# Patient Record
Sex: Female | Born: 1950 | Race: White | Hispanic: No | Marital: Married | Smoking: Former smoker
Health system: Southern US, Community
[De-identification: ages and names within clinical notes are randomized; demographics above are authoritative.]

## PROBLEM LIST (undated history)

## (undated) DIAGNOSIS — E119 Type 2 diabetes mellitus without complications: Secondary | ICD-10-CM

## (undated) DIAGNOSIS — E785 Hyperlipidemia, unspecified: Secondary | ICD-10-CM

## (undated) DIAGNOSIS — E538 Deficiency of other specified B group vitamins: Secondary | ICD-10-CM

## (undated) DIAGNOSIS — I10 Essential (primary) hypertension: Secondary | ICD-10-CM

## (undated) DIAGNOSIS — I7090 Unspecified atherosclerosis: Secondary | ICD-10-CM

## (undated) DIAGNOSIS — I739 Peripheral vascular disease, unspecified: Secondary | ICD-10-CM

## (undated) HISTORY — DX: Essential (primary) hypertension: I10

---

## 2009-01-05 ENCOUNTER — Ambulatory Visit: Payer: Self-pay | Admitting: Internal Medicine

## 2016-06-17 ENCOUNTER — Ambulatory Visit (INDEPENDENT_AMBULATORY_CARE_PROVIDER_SITE_OTHER): Payer: Commercial Managed Care - HMO | Admitting: Vascular Surgery

## 2016-06-17 ENCOUNTER — Other Ambulatory Visit (INDEPENDENT_AMBULATORY_CARE_PROVIDER_SITE_OTHER): Payer: Self-pay | Admitting: Podiatry

## 2016-06-17 ENCOUNTER — Encounter (INDEPENDENT_AMBULATORY_CARE_PROVIDER_SITE_OTHER): Payer: Commercial Managed Care - HMO

## 2016-06-17 ENCOUNTER — Other Ambulatory Visit (INDEPENDENT_AMBULATORY_CARE_PROVIDER_SITE_OTHER): Payer: Self-pay | Admitting: Vascular Surgery

## 2016-06-17 ENCOUNTER — Encounter (INDEPENDENT_AMBULATORY_CARE_PROVIDER_SITE_OTHER): Payer: Self-pay | Admitting: Vascular Surgery

## 2016-06-17 ENCOUNTER — Encounter (INDEPENDENT_AMBULATORY_CARE_PROVIDER_SITE_OTHER): Payer: Self-pay

## 2016-06-17 DIAGNOSIS — I70219 Atherosclerosis of native arteries of extremities with intermittent claudication, unspecified extremity: Secondary | ICD-10-CM | POA: Insufficient documentation

## 2016-06-17 DIAGNOSIS — R0989 Other specified symptoms and signs involving the circulatory and respiratory systems: Secondary | ICD-10-CM

## 2016-06-17 DIAGNOSIS — M79675 Pain in left toe(s): Secondary | ICD-10-CM | POA: Diagnosis not present

## 2016-06-17 DIAGNOSIS — I743 Embolism and thrombosis of arteries of the lower extremities: Secondary | ICD-10-CM | POA: Diagnosis not present

## 2016-06-17 DIAGNOSIS — I998 Other disorder of circulatory system: Secondary | ICD-10-CM | POA: Diagnosis not present

## 2016-06-17 DIAGNOSIS — I70229 Atherosclerosis of native arteries of extremities with rest pain, unspecified extremity: Secondary | ICD-10-CM | POA: Diagnosis not present

## 2016-06-17 DIAGNOSIS — F17200 Nicotine dependence, unspecified, uncomplicated: Secondary | ICD-10-CM | POA: Insufficient documentation

## 2016-06-17 NOTE — Progress Notes (Signed)
[facility] Admission History & Physical  MRN : [id number]  Sherri Dawson is a 65 y.o. (08-30-1950) female who presents with chief complaint of  Chief Complaint  Patient presents with  . Follow-up  .  History of Present Illness: I am asked to evaluate the patient emergently by Dr Elvina Mattes.  The patient is seen for evaluation of painful lower extremities and diminished pulses associated with abrupt onset of pain and blue discoloration of the left great and second toes.  The patient notes the change has been present for a couple weeks and has not been improving.  It is very painful but there is no ulceration or drainage.  No specific history of trauma noted by the patient.  The patient denies fever or chills.  the patient does have diabetes which has been difficult to control.  Patient notes prior to the discoloration developing the extremities were not painful with ambulation or activity.    The patient denies rest pain or dangling of an extremity off the side of the bed during the night for relief. No prior interventions or surgeries.  No history of back problems or DJD of the lumbar sacral spine.   The patient denies amaurosis fugax or recent TIA symptoms. There are no recent neurological changes noted. The patient denies history of DVT, PE or superficial thrombophlebitis. The patient denies recent episodes of angina or shortness of breath.   ABI's Rt=1.12 and Lt=0.75  No outpatient prescriptions have been marked as taking for the 06/17/16 encounter (Office Visit) with Katha Cabal, MD.    History reviewed. No pertinent past medical history.  History reviewed. No pertinent surgical history.  Social History Social History  Substance Use Topics  . Smoking status: Current Every Day Smoker  . Smokeless tobacco: Never Used  . Alcohol use Yes    Family History History reviewed. No pertinent family history. No family history of bleeding/clotting  disorders, porphyria or autoimmune disease   No Known Allergies   REVIEW OF SYSTEMS (Negative unless checked)  Constitutional: [] Weight loss  [] Fever  [] Chills Cardiac: [] Chest pain   [] Chest pressure   [] Palpitations   [] Shortness of breath when laying flat   [] Shortness of breath with exertion. Vascular:  [x] Pain in legs with walking   [x] Pain in legs at rest  [] History of DVT   [] Phlebitis   [x] Swelling in legs   [] Varicose veins   [] Non-healing ulcers Pulmonary:   [] Uses home oxygen   [] Productive cough   [] Hemoptysis   [] Wheeze  [] COPD   [] Asthma Neurologic:  [] Dizziness   [] Seizures   [] History of stroke   [] History of TIA  [] Aphasia   [] Vissual changes   [] Weakness or numbness in arm   [] Weakness or numbness in leg Musculoskeletal:   [] Joint swelling   [] Joint pain   [] Low back pain Hematologic:  [] Easy bruising  [] Easy bleeding   [] Hypercoagulable state   [] Anemic Gastrointestinal:  [] Diarrhea   [] Vomiting  [] Gastroesophageal reflux/heartburn   [] Difficulty swallowing. Genitourinary:  [] Chronic kidney disease   [] Difficult urination  [] Frequent urination   [] Blood in urine Skin:  [] Rashes   [] Ulcers  Psychological:  [] History of anxiety   []  History of major depression.  Physical Examination  Vitals:   06/17/16 1442  BP: (!) 192/104  Pulse: 89  Resp: 17  Weight: 155 lb (70.3 kg)  Height: 5' 4"  (1.626 m)   Body mass index is 26.61 kg/m. Gen: WD/WN, NAD Head: [place]/AT, No temporalis wasting.  Ear/Nose/Throat:  Hearing grossly intact, nares w/o erythema or drainage, poor dentition Eyes: PER, EOMI, sclera nonicteric.  Neck: Supple, no masses.  No bruit or JVD.  Pulmonary:  Good air movement, clear to auscultation bilaterally, no use of accessory muscles.  Cardiac: RRR, normal S1, S2, no Murmurs. Vascular:  Left first and second toes deeply cyanotic to the level of the met head.  2 second cap refill present, no ulcers Vessel Right Left  Radial Palpable Palpable  Ulnar  Palpable Palpable  Brachial Palpable Palpable  Carotid Palpable Palpable  Femoral Palpable Palpable  Popliteal Palpable 1+ Palpable  PT Palpable Not Palpable  DP Palpable Not Palpable   Gastrointestinal: soft, non-distended. No guarding/no peritoneal signs.  Musculoskeletal: M/S 5/5 throughout.  No deformity or atrophy.  Neurologic: CN 2-12 intact. Pain and light touch intact in extremities.  Symmetrical.  Speech is fluent. Motor exam as listed above. Psychiatric: Judgment intact, Mood & affect appropriate for pt's clinical situation. Dermatologic: No rashes or ulcers noted.  No changes consistent with cellulitis. Lymph : No Cervical lymphadenopathy, no lichenification or skin changes of chronic lymphedema.  CBC No results found for: WBC, HGB, HCT, MCV, PLT  BMET No results found for: NA, K, CL, CO2, GLUCOSE, BUN, CREATININE, CALCIUM, GFRNONAA, GFRAA CrCl cannot be calculated (No order found.).  COAG No results found for: INR, PROTIME  Radiology No results found.  Assessment/Plan 1. 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  This represents a limb threatening ischemia and places the patient at the risk for limb loss.  Patient should undergo angiography of the lower extremities with the hope for intervention for limb salvage.  The risks and benefits as well as the alternative therapies was discussed in detail with the patient.  All questions were answered.  Patient agrees to proceed with angiography.  The patient will follow up with me in the office after the procedure.    A total of 70 minutes was spent with this patient and greater than 50% was spent in counseling and coordination of care with the patient.  Discussion  included the treatment options for vascular disease including indications for surgery and intervention.  Also discussed is the appropriate timing of treatment.  In addition medical therapy was discussed.    2. Atherosclerosis of artery of extremity with rest pain ([place]) SEE plan #1  3. Tobacco dependency The key role of smoking in the process was also discussed smoking cessation was discussed for abut 10 minutes.    Hortencia Pilar, MD  06/17/2016 5:04 PM

## 2016-06-18 ENCOUNTER — Other Ambulatory Visit
Admission: RE | Admit: 2016-06-18 | Discharge: 2016-06-18 | Disposition: A | Discharge status: Home / Self Care | Payer: Commercial Managed Care - HMO | Source: Ambulatory Visit | Attending: Vascular Surgery | Admitting: Vascular Surgery

## 2016-06-18 DIAGNOSIS — I739 Peripheral vascular disease, unspecified: Secondary | ICD-10-CM | POA: Insufficient documentation

## 2016-06-18 LAB — CREATININE, SERUM
Creatinine, Ser: 0.86 mg/dL (ref 0.44–1.00)
GFR calc Af Amer: >60 mL/min (ref >60)
GFR calc non Af Amer: >60 mL/min (ref >60)

## 2016-06-18 LAB — BUN: BUN: 13 mg/dL (ref 6–20)

## 2016-06-19 ENCOUNTER — Encounter: Payer: Self-pay | Admitting: *Deleted

## 2016-06-19 ENCOUNTER — Ambulatory Visit
Admission: RE | Admit: 2016-06-19 | Discharge: 2016-06-19 | Disposition: A | Discharge status: Home / Self Care | Payer: Commercial Managed Care - HMO | Source: Ambulatory Visit | Attending: Vascular Surgery | Admitting: Vascular Surgery

## 2016-06-19 ENCOUNTER — Encounter
Admission: RE | Disposition: A | Discharge status: Home / Self Care | Payer: Self-pay | Source: Ambulatory Visit | Attending: Vascular Surgery

## 2016-06-19 DIAGNOSIS — I70228 Atherosclerosis of native arteries of extremities with rest pain, other extremity: Secondary | ICD-10-CM | POA: Diagnosis not present

## 2016-06-19 DIAGNOSIS — I8290 Acute embolism and thrombosis of unspecified vein: Secondary | ICD-10-CM | POA: Insufficient documentation

## 2016-06-19 DIAGNOSIS — F17200 Nicotine dependence, unspecified, uncomplicated: Secondary | ICD-10-CM | POA: Insufficient documentation

## 2016-06-19 DIAGNOSIS — I70212 Atherosclerosis of native arteries of extremities with intermittent claudication, left leg: Secondary | ICD-10-CM | POA: Diagnosis not present

## 2016-06-19 HISTORY — PX: PERIPHERAL VASCULAR CATHETERIZATION: SHX172C

## 2016-06-19 SURGERY — LOWER EXTREMITY ANGIOGRAPHY
Anesthesia: Moderate Sedation | Site: Leg Lower | Laterality: Left

## 2016-06-19 MED ORDER — MIDAZOLAM HCL 2 MG/2ML IJ SOLN
INTRAMUSCULAR | Status: DC | PRN
  Administered 2016-06-19 (×2): 1 mg via INTRAVENOUS
  Administered 2016-06-19: 2 mg via INTRAVENOUS

## 2016-06-19 MED ORDER — HYDRALAZINE HCL 20 MG/ML IJ SOLN: 5.0000 mg | INTRAMUSCULAR | Status: DC | PRN

## 2016-06-19 MED ORDER — HYDROMORPHONE HCL 1 MG/ML IJ SOLN
INTRAMUSCULAR | Status: AC
  Filled 2016-06-19: qty 1

## 2016-06-19 MED ORDER — MIDAZOLAM HCL 5 MG/5ML IJ SOLN
INTRAMUSCULAR | Status: AC
Start: 2016-06-19 — End: 2016-06-19
  Filled 2016-06-19: qty 5

## 2016-06-19 MED ORDER — LIDOCAINE HCL (PF) 1 % IJ SOLN
INTRAMUSCULAR | Status: AC
  Filled 2016-06-19: qty 30

## 2016-06-19 MED ORDER — CLOPIDOGREL BISULFATE 75 MG PO TABS: 75.0000 mg | ORAL_TABLET | Freq: Every day | ORAL | 4 refills | Status: DC

## 2016-06-19 MED ORDER — ONDANSETRON HCL 4 MG/2ML IJ SOLN: 4.0000 mg | Freq: Four times a day (QID) | INTRAMUSCULAR | Status: DC | PRN

## 2016-06-19 MED ORDER — ACETAMINOPHEN 325 MG PO TABS: 325.0000 mg | ORAL_TABLET | ORAL | Status: DC | PRN

## 2016-06-19 MED ORDER — MORPHINE SULFATE (PF) 4 MG/ML IV SOLN: 2.0000 mg | INTRAVENOUS | Status: DC | PRN

## 2016-06-19 MED ORDER — OXYCODONE HCL 5 MG PO TABS: 5.0000 mg | ORAL_TABLET | ORAL | Status: DC | PRN

## 2016-06-19 MED ORDER — FENTANYL CITRATE (PF) 100 MCG/2ML IJ SOLN
INTRAMUSCULAR | Status: AC
  Filled 2016-06-19: qty 2

## 2016-06-19 MED ORDER — DEXTROSE 5 % IV SOLN: 1.5000 g | INTRAVENOUS | Status: DC

## 2016-06-19 MED ORDER — DOCUSATE SODIUM 100 MG PO CAPS: 100.0000 mg | ORAL_CAPSULE | Freq: Every day | ORAL | Status: DC

## 2016-06-19 MED ORDER — SODIUM CHLORIDE 0.9 % IV SOLN
INTRAVENOUS | Status: DC
  Administered 2016-06-19: 13:00:00 via INTRAVENOUS

## 2016-06-19 MED ORDER — PANTOPRAZOLE SODIUM 40 MG PO TBEC: 40.0000 mg | DELAYED_RELEASE_TABLET | Freq: Every day | ORAL | Status: DC

## 2016-06-19 MED ORDER — CLOPIDOGREL BISULFATE 75 MG PO TABS
ORAL_TABLET | ORAL | Status: AC
  Filled 2016-06-19: qty 4

## 2016-06-19 MED ORDER — CLOPIDOGREL BISULFATE 75 MG PO TABS
300.0000 mg | ORAL_TABLET | ORAL | Status: AC
  Administered 2016-06-19: 300 mg via ORAL

## 2016-06-19 MED ORDER — BACITRACIN-NEOMYCIN-POLYMYXIN 400-5-5000 EX OINT
TOPICAL_OINTMENT | CUTANEOUS | Status: AC
  Filled 2016-06-19: qty 1

## 2016-06-19 MED ORDER — HEPARIN (PORCINE) IN NACL 2-0.9 UNIT/ML-% IJ SOLN
INTRAMUSCULAR | Status: AC
  Filled 2016-06-19: qty 1000

## 2016-06-19 MED ORDER — ALUM & MAG HYDROXIDE-SIMETH 200-200-20 MG/5ML PO SUSP: 15.0000 mL | ORAL | Status: DC | PRN

## 2016-06-19 MED ORDER — METHYLPREDNISOLONE SODIUM SUCC 125 MG IJ SOLR: 125.0000 mg | INTRAMUSCULAR | Status: DC | PRN

## 2016-06-19 MED ORDER — LABETALOL HCL 5 MG/ML IV SOLN: 10.0000 mg | INTRAVENOUS | Status: DC | PRN

## 2016-06-19 MED ORDER — HYDROMORPHONE HCL 1 MG/ML IJ SOLN: 1.0000 mg | Freq: Once | INTRAMUSCULAR | Status: DC

## 2016-06-19 MED ORDER — HEPARIN SODIUM (PORCINE) 1000 UNIT/ML IJ SOLN
INTRAMUSCULAR | Status: DC | PRN
  Administered 2016-06-19: 5000 [IU] via INTRAVENOUS

## 2016-06-19 MED ORDER — HEPARIN SODIUM (PORCINE) 1000 UNIT/ML IJ SOLN
INTRAMUSCULAR | Status: AC
  Filled 2016-06-19: qty 1

## 2016-06-19 MED ORDER — IOPAMIDOL (ISOVUE-300) INJECTION 61%
INTRAVENOUS | Status: DC | PRN
  Administered 2016-06-19: 70 mL via INTRAVENOUS

## 2016-06-19 MED ORDER — ACETAMINOPHEN 325 MG RE SUPP: 325.0000 mg | RECTAL | Status: DC | PRN

## 2016-06-19 MED ORDER — FENTANYL CITRATE (PF) 100 MCG/2ML IJ SOLN
INTRAMUSCULAR | Status: DC | PRN
  Administered 2016-06-19 (×3): 50 ug via INTRAVENOUS

## 2016-06-19 MED ORDER — FAMOTIDINE 20 MG PO TABS: 40.0000 mg | ORAL_TABLET | ORAL | Status: DC | PRN

## 2016-06-19 MED ORDER — METOPROLOL TARTRATE 5 MG/5ML IV SOLN: 5.0000 mg | Freq: Four times a day (QID) | INTRAVENOUS | Status: DC

## 2016-06-19 MED ORDER — HYDROMORPHONE HCL 1 MG/ML IJ SOLN
0.5000 mg | Freq: Once | INTRAMUSCULAR | Status: AC
  Administered 2016-06-19: 0.5 mg via INTRAVENOUS

## 2016-06-19 SURGICAL SUPPLY — 18 items
BALLN LUTONIX DCB 5X40X130 (BALLOONS) ×3
BALLN LUTONIX DCB 6X40X130 (BALLOONS) ×3
BALLOON LUTONIX DCB 5X40X130 (BALLOONS) ×1 IMPLANT
BALLOON LUTONIX DCB 6X40X130 (BALLOONS) ×1 IMPLANT
CATH PIG 70CM (CATHETERS) ×3 IMPLANT
DEVICE PRESTO INFLATION (MISCELLANEOUS) ×3 IMPLANT
DEVICE STARCLOSE SE CLOSURE (Vascular Products) ×3 IMPLANT
DEVICE TORQUE (MISCELLANEOUS) ×3 IMPLANT
GLIDEWIRE ANGLED SS 035X260CM (WIRE) ×3 IMPLANT
LIFESTENT 7X40X130 (Permanent Stent) ×3 IMPLANT
PACK ANGIOGRAPHY (CUSTOM PROCEDURE TRAY) ×3 IMPLANT
SET INTRO CAPELLA COAXIAL (SET/KITS/TRAYS/PACK) ×3 IMPLANT
SHEATH BRITE TIP 5FRX11 (SHEATH) ×3 IMPLANT
SHEATH RAABE 6FR (SHEATH) ×3 IMPLANT
SYR MEDRAD MARK V 150ML (SYRINGE) ×3 IMPLANT
TUBING CONTRAST HIGH PRESS 72 (TUBING) ×3 IMPLANT
WIRE HI TORQ VERSACORE 300 (WIRE) ×3 IMPLANT
WIRE J 3MM .035X145CM (WIRE) ×3 IMPLANT

## 2016-06-19 NOTE — Op Note (Signed)
[facility] Percutaneous Study/Intervention Procedural Note   Date of Surgery: 06/19/2016  Surgeon:  Katha Cabal, MD.  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 Introduction catheter into left lower extremity 3rd order catheter placement  2. Contrast injection left lower extremity for distal runoff   3. Percutaneous transluminal angioplasty and stent placement 7 x 40 life stent left superficial femoral artery  4. Star close closure right common femoral arteriotomy  Anesthesia: Conscious sedation was administered under my direct supervision. IV Versed plus fentanyl were utilized. Continuous ECG, pulse oximetry and blood pressure was monitored throughout the entire procedure.  Conscious sedation was for a total of 50 minutes.  Sheath: 6 French Rabi right common femoral artery  Contrast: 70 cc  Fluoroscopy Time: 5.3 minutes  Indications: Sherri Dawson presents with ischemia of the left great toe and second toe. In the office her ABI was 0.7. The risks and benefits are reviewed all questions answered patient agrees to proceed.  Procedure: Sherri Dawson is a 66 y.o. y.o. female who was identified and appropriate procedural time out was performed. The patient was then placed supine on the table and prepped and draped in the usual sterile fashion.   Ultrasound was placed in the sterile sleeve and the right groin was evaluated the right common femoral artery was echolucent and pulsatile indicating patency.  Image was recorded for the permanent record and under real-time visualization a microneedle was inserted into the common femoral artery microwire followed by a micro-sheath.  A J-wire was then advanced through the micro-sheath and a  5 [place] sheath was then  inserted over a J-wire. J-wire was then advanced and a 5 French pigtail catheter was positioned at the level of T12. AP projection of the aorta was then obtained. Pigtail catheter was repositioned to above the bifurcation and a RAO view of the pelvis was obtained.  Subsequently a pigtail catheter with the stiff angle Glidewire was used to cross the aortic bifurcation the catheter wire were advanced down into the left distal external iliac artery. Oblique view of the femoral bifurcation was then obtained and subsequently the wire was reintroduced and the pigtail catheter negotiated into the SFA representing third order catheter placement. Distal runoff was then performed.  5000 units of heparin was then given and allowed to circulate and a 6 [place] Rabi sheath was advanced up and over the bifurcation and positioned in the femoral artery  Sherri Dawson  catheter and stiff angle Glidewire were then negotiated down into the distal popliteal.  Distal runoff was then completed by hand injection through the catheter. The wire was then reintroduced and a 5 x 40 Lutonix balloon was used to angioplasty the superficial femoral artery. Inflations were to 12 atmospheres for 2 minutes. Follow-up imaging demonstrated greater than 50% residual stenosis and therefore a 7 x 40 life stent was deployed and subsequently postdilated with a  6 x 40 Lutonix balloon was utilized inflating to 12 atm for 2 full minutes.  Distal runoff was then reassessed.  After review of these images the sheath is pulled into the right external iliac oblique of the common femoral is obtained and a Star close device deployed. There no immediate complications.   Findings: The abdominal aorta is opacified with a bolus injection contrast. Renal arteries are widely patent. The aorta itself has diffuse disease but no hemodynamically significant lesions. The common and  external iliac arteries are widely patent bilaterally.  The left common femoral is widely  patent as is the profunda femoris.  The SFA does indeed have a significant stenosis in its midportion this is a focal 1 cm lesion.  The distal popliteal demonstrates wide patency trifurcation is patent however the anterior tibial is somewhat small and does not contribute significantly to the flow with the foot the posterior tibial is the dominant vessel and has and tenuous in-line flow down to the foot filling the pedal arch. The peroneal is patent although somewhat small.   Following angioplasty the SFA shows an inadequate dilatation and therefore a stent is placed and postdilated to 6 mm with an excellent result.   Summary: Successful recanalization left lower cavity for limb salvage    Disposition: Patient was taken to the recovery room in stable condition having tolerated the procedure well.  Dawson, Sherri Lory 06/19/2016,2:53 PM

## 2016-06-19 NOTE — H&P (Signed)
[facility] History & Physical Update  The patient was interviewed and re-examined.  The patient's previous History and Physical has been reviewed and is unchanged.  There is no change in the plan of care. We plan to proceed with the scheduled procedure.  Hortencia Pilar, MD  06/19/2016, 1:25 PM

## 2016-06-19 NOTE — Discharge Instructions (Signed)

## 2016-06-20 ENCOUNTER — Encounter: Payer: Self-pay | Admitting: Vascular Surgery

## 2016-06-27 ENCOUNTER — Other Ambulatory Visit: Payer: Self-pay | Admitting: Internal Medicine

## 2016-06-27 DIAGNOSIS — R739 Hyperglycemia, unspecified: Secondary | ICD-10-CM | POA: Diagnosis not present

## 2016-06-27 DIAGNOSIS — I739 Peripheral vascular disease, unspecified: Secondary | ICD-10-CM

## 2016-06-27 DIAGNOSIS — Z0000 Encounter for general adult medical examination without abnormal findings: Secondary | ICD-10-CM | POA: Diagnosis not present

## 2016-06-27 DIAGNOSIS — I10 Essential (primary) hypertension: Secondary | ICD-10-CM | POA: Diagnosis not present

## 2016-07-03 ENCOUNTER — Ambulatory Visit
Admission: RE | Admit: 2016-07-03 | Discharge: 2016-07-03 | Disposition: A | Discharge status: Home / Self Care | Payer: Commercial Managed Care - HMO | Source: Ambulatory Visit | Attending: Internal Medicine | Admitting: Internal Medicine

## 2016-07-03 DIAGNOSIS — I6523 Occlusion and stenosis of bilateral carotid arteries: Secondary | ICD-10-CM | POA: Diagnosis not present

## 2016-07-03 DIAGNOSIS — I739 Peripheral vascular disease, unspecified: Secondary | ICD-10-CM | POA: Diagnosis not present

## 2016-07-31 ENCOUNTER — Other Ambulatory Visit (INDEPENDENT_AMBULATORY_CARE_PROVIDER_SITE_OTHER): Payer: Self-pay | Admitting: Vascular Surgery

## 2016-07-31 DIAGNOSIS — I739 Peripheral vascular disease, unspecified: Secondary | ICD-10-CM

## 2016-08-01 ENCOUNTER — Encounter (INDEPENDENT_AMBULATORY_CARE_PROVIDER_SITE_OTHER): Payer: Self-pay | Admitting: Vascular Surgery

## 2016-08-01 ENCOUNTER — Ambulatory Visit (INDEPENDENT_AMBULATORY_CARE_PROVIDER_SITE_OTHER): Payer: Commercial Managed Care - HMO

## 2016-08-01 ENCOUNTER — Ambulatory Visit (INDEPENDENT_AMBULATORY_CARE_PROVIDER_SITE_OTHER): Payer: Commercial Managed Care - HMO | Admitting: Vascular Surgery

## 2016-08-01 VITALS — BP 144/92 | HR 92 | Resp 16 | Ht 64.0 in | Wt 158.0 lb

## 2016-08-01 DIAGNOSIS — I739 Peripheral vascular disease, unspecified: Secondary | ICD-10-CM | POA: Diagnosis not present

## 2016-08-01 DIAGNOSIS — M79605 Pain in left leg: Secondary | ICD-10-CM

## 2016-08-01 DIAGNOSIS — E782 Mixed hyperlipidemia: Secondary | ICD-10-CM | POA: Diagnosis not present

## 2016-08-01 DIAGNOSIS — E785 Hyperlipidemia, unspecified: Secondary | ICD-10-CM | POA: Insufficient documentation

## 2016-08-01 DIAGNOSIS — I70229 Atherosclerosis of native arteries of extremities with rest pain, unspecified extremity: Secondary | ICD-10-CM

## 2016-08-01 DIAGNOSIS — M79606 Pain in leg, unspecified: Secondary | ICD-10-CM | POA: Insufficient documentation

## 2016-08-01 DIAGNOSIS — I10 Essential (primary) hypertension: Secondary | ICD-10-CM | POA: Insufficient documentation

## 2016-08-01 DIAGNOSIS — F17200 Nicotine dependence, unspecified, uncomplicated: Secondary | ICD-10-CM | POA: Diagnosis not present

## 2016-08-01 NOTE — Progress Notes (Signed)
MRN : [id number]  Sherri Dawson is a 65 y.o. (22-Feb-1951) female who presents with chief complaint of  Chief Complaint  Patient presents with  . Re-evaluation    Angio done on 06/19/16, ABI  .  History of Present Illness: The patient returns to the office for followup and review status post angiogram with intervention. On 06/19/2016 she underwent PTA and stenting of the left SFA.  The patient notes improvement in the lower extremity symptoms. No interval shortening of the patient's claudication distance, in fact she notes she is walking easily again.  No rest pain symptoms.  No new ulcers or wounds have occurred since the last visit.  There have been no significant changes to the patient's overall health care.  The patient denies amaurosis fugax or recent TIA symptoms. There are no recent neurological changes noted. The patient denies history of DVT, PE or superficial thrombophlebitis. The patient denies recent episodes of angina or shortness of breath.   ABI's Rt=1.06 and Lt=0.99  (previous ABI's Rt=1.12 and Lt=0.75)   Current Meds  Medication Sig  . aspirin EC 81 MG tablet Take by mouth.  . clopidogrel (PLAVIX) 75 MG tablet Take 1 tablet (75 mg total) by mouth daily.  Marland Kitchen ibuprofen (ADVIL,MOTRIN) 200 MG tablet Take 400 mg by mouth every 6 (six) hours as needed for headache or moderate pain.  Marland Kitchen olmesartan-hydrochlorothiazide (BENICAR HCT) 20-12.5 MG tablet Take by mouth.  . simvastatin (ZOCOR) 40 MG tablet Take by mouth.    No past medical history on file.  Past Surgical History:  Procedure Laterality Date  . PERIPHERAL VASCULAR CATHETERIZATION Left 06/19/2016   Procedure: Lower Extremity Angiography;  Surgeon: Katha Cabal, MD;  Location: [facility];  Service: Cardiovascular;  Laterality: Left;    Social History Social History  Substance Use Topics  . Smoking status: Former Smoker    Packs/day: 0.50    Years: 45.00    Types: Cigarettes    Quit date:  06/18/2016  . Smokeless tobacco: Never Used  . Alcohol use Yes    Family History No family history on file. No family history of bleeding/clotting disorders, porphyria or autoimmune disease  No Known Allergies   REVIEW OF SYSTEMS (Negative unless checked)  Constitutional: [] Weight loss  [] Fever  [] Chills Cardiac: [] Chest pain   [] Chest pressure   [] Palpitations   [] Shortness of breath when laying flat   [] Shortness of breath with exertion. Vascular:  [] Pain in legs with walking   [] Pain in legs at rest  [] History of DVT   [] Phlebitis   [] Swelling in legs   [] Varicose veins   [] Non-healing ulcers Pulmonary:   [] Uses home oxygen   [] Productive cough   [] Hemoptysis   [] Wheeze  [] COPD   [] Asthma Neurologic:  [] Dizziness   [] Seizures   [] History of stroke   [] History of TIA  [] Aphasia   [] Vissual changes   [] Weakness or numbness in arm   [] Weakness or numbness in leg Musculoskeletal:   [] Joint swelling   [] Joint pain   [] Low back pain Hematologic:  [] Easy bruising  [] Easy bleeding   [] Hypercoagulable state   [] Anemic Gastrointestinal:  [] Diarrhea   [] Vomiting  [] Gastroesophageal reflux/heartburn   [] Difficulty swallowing. Genitourinary:  [] Chronic kidney disease   [] Difficult urination  [] Frequent urination   [] Blood in urine Skin:  [] Rashes   [] Ulcers  Psychological:  [] History of anxiety   []  History of major depression.  Physical Examination  Vitals:   08/01/16 1530  BP: (!) 144/92  Pulse: 92  Resp: 16  Weight: 158 lb (71.7 kg)  Height: 5\' 4"  (1.626 m)   Body mass index is 27.12 kg/m. Gen: WD/WN, NAD Head: [place]/AT, No temporalis wasting.  Ear/Nose/Throat: Hearing grossly intact, nares w/o erythema or drainage, poor dentition Eyes: PER, EOMI, sclera nonicteric.  Neck: Supple, no masses.  No bruit or JVD.  Pulmonary:  Good air movement, clear to auscultation bilaterally, no use of accessory muscles.  Cardiac: RRR, normal S1, S2, no Murmurs. Vascular: feet pink and warm  bilaterally with brisk cap refill Vessel Right Left  Radial Palpable Palpable  Ulnar Palpable Palpable  Brachial Palpable Palpable  Carotid Palpable Palpable  Femoral Palpable Palpable  Popliteal Palpable Palpable  PT 2+ Palpable 2+ Palpable  DP 2+ Palpable Trace Palpable   Gastrointestinal: soft, non-distended. No guarding/no peritoneal signs.  Musculoskeletal: M/S 5/5 throughout.  No deformity or atrophy.  Neurologic: CN 2-12 intact. Pain and light touch intact in extremities.  Symmetrical.  Speech is fluent. Motor exam as listed above. Psychiatric: Judgment intact, Mood & affect appropriate for pt's clinical situation. Dermatologic: No rashes or ulcers noted.  No changes consistent with cellulitis. Lymph : No Cervical lymphadenopathy, no lichenification or skin changes of chronic lymphedema.  CBC No results found for: WBC, HGB, HCT, MCV, PLT  BMET    Component Value Date/Time   BUN 13 06/18/2016 0914   CREATININE 0.86 06/18/2016 0914   GFRNONAA >60 06/18/2016 0914   GFRAA >60 06/18/2016 0914   CrCl cannot be calculated (Patient's most recent lab result is older than the maximum 21 days allowed.).  COAG No results found for: INR, PROTIME  Radiology US Carotid Bilateral  Result Date: 07/03/2016 CLINICAL DATA:  65 year old female with a history of peripheral artery disease. Cardiovascular risk factors include hypertension, hyperlipidemia, known peripheral vascular disease, tobacco use EXAM: BILATERAL CAROTID DUPLEX ULTRASOUND TECHNIQUE: Pearline Cables scale imaging, color Doppler and duplex ultrasound were performed of bilateral carotid and vertebral arteries in the neck. COMPARISON:  None FINDINGS: Criteria: Quantification of carotid stenosis is based on velocity parameters that correlate the residual internal carotid diameter with NASCET-based stenosis levels, using the diameter of the distal internal carotid lumen as the denominator for stenosis measurement. The following velocity  measurements were obtained: RIGHT ICA:  Systolic 99991111 cm/sec, Diastolic 38 cm/sec CCA:  [id number] cm/sec SYSTOLIC ICA/CCA RATIO:  1.1 ECA:  138 cm/sec LEFT ICA:  Systolic 123456 cm/sec, Diastolic 17 cm/sec CCA:  [id number] cm/sec SYSTOLIC ICA/CCA RATIO:  0.7 ECA:  120 cm/sec Right Brachial SBP: Not acquired Left Brachial SBP: Not acquired RIGHT CAROTID ARTERY: No significant calcified disease of the right common carotid artery. Intermediate waveform maintained. Heterogeneous plaque without significant calcifications at the right carotid bifurcation. Low resistance waveform of the right ICA. No significant tortuosity. RIGHT VERTEBRAL ARTERY: Antegrade flow with low resistance waveform. LEFT CAROTID ARTERY: No significant calcified disease of the left common carotid artery. Intermediate waveform maintained. Heterogeneous plaque at the left carotid bifurcation without significant calcifications. Low resistance waveform of the left ICA. LEFT VERTEBRAL ARTERY:  Antegrade flow with low resistance waveform. Additional: Lymph node of the right neck, not enlarged by head and neck criteria. IMPRESSION: Color duplex indicates minimal heterogeneous plaque, with no hemodynamically significant stenosis by duplex criteria in the extracranial cerebrovascular circulation. Signed, Dulcy Fanny. Earleen Newport, DO [facility] Electronically Signed   By: Corrie Mckusick D.O.   On: 07/03/2016 15:40    Assessment/Plan 1. Atherosclerosis of artery of extremity with rest  pain [facility]) Recommend:  The patient is status post successful angiogram with intervention.  The patient reports that the claudication symptoms and leg pain is essentially gone.   The patient denies lifestyle limiting changes at this point in time.  No further invasive studies, angiography or surgery at this time The patient should continue walking and begin a more formal exercise program.  The patient should continue antiplatelet  therapy and aggressive treatment of the lipid abnormalities  Smoking cessation was again discussed  The patient should continue wearing graduated compression socks 10-15 mmHg strength to control the mild edema.  Patient should undergo noninvasive studies as ordered. The patient will follow up with me after the studies.   - VAS [place] ABI WITH/WO TBI; Future - VAS [place] LOWER EXTREMITY ARTERIAL DUPLEX; Future  2. Tobacco dependency Approximately 5 minutes was spent on discussing smoking cessation  3. Pain of left lower extremity essentially resolved  4. Essential hypertension Continue antihypertensive medications as already ordered and reviewed, no changes at this time.  5. Mixed hyperlipidemia Continue statin as ordered and reviewed, no changes at this time    Hortencia Pilar, MD  08/01/2016 3:54 PM

## 2016-09-19 DIAGNOSIS — I739 Peripheral vascular disease, unspecified: Secondary | ICD-10-CM | POA: Diagnosis not present

## 2016-09-19 DIAGNOSIS — I10 Essential (primary) hypertension: Secondary | ICD-10-CM | POA: Diagnosis not present

## 2016-09-19 DIAGNOSIS — E538 Deficiency of other specified B group vitamins: Secondary | ICD-10-CM | POA: Diagnosis not present

## 2016-09-26 DIAGNOSIS — R739 Hyperglycemia, unspecified: Secondary | ICD-10-CM | POA: Diagnosis not present

## 2016-09-26 DIAGNOSIS — I739 Peripheral vascular disease, unspecified: Secondary | ICD-10-CM | POA: Diagnosis not present

## 2016-09-26 DIAGNOSIS — E538 Deficiency of other specified B group vitamins: Secondary | ICD-10-CM | POA: Diagnosis not present

## 2016-09-26 DIAGNOSIS — Z0000 Encounter for general adult medical examination without abnormal findings: Secondary | ICD-10-CM | POA: Insufficient documentation

## 2016-10-31 ENCOUNTER — Encounter (INDEPENDENT_AMBULATORY_CARE_PROVIDER_SITE_OTHER): Payer: Self-pay

## 2016-10-31 ENCOUNTER — Ambulatory Visit (INDEPENDENT_AMBULATORY_CARE_PROVIDER_SITE_OTHER): Payer: Medicare HMO

## 2016-10-31 ENCOUNTER — Ambulatory Visit (INDEPENDENT_AMBULATORY_CARE_PROVIDER_SITE_OTHER): Payer: Medicare HMO | Admitting: Vascular Surgery

## 2016-10-31 ENCOUNTER — Encounter (INDEPENDENT_AMBULATORY_CARE_PROVIDER_SITE_OTHER): Payer: Self-pay | Admitting: Vascular Surgery

## 2016-10-31 VITALS — BP 149/78 | HR 72 | Resp 16 | Ht 64.0 in | Wt 158.0 lb

## 2016-10-31 DIAGNOSIS — M79605 Pain in left leg: Secondary | ICD-10-CM

## 2016-10-31 DIAGNOSIS — I70229 Atherosclerosis of native arteries of extremities with rest pain, unspecified extremity: Secondary | ICD-10-CM

## 2016-10-31 DIAGNOSIS — I70219 Atherosclerosis of native arteries of extremities with intermittent claudication, unspecified extremity: Secondary | ICD-10-CM | POA: Diagnosis not present

## 2016-10-31 DIAGNOSIS — I743 Embolism and thrombosis of arteries of the lower extremities: Secondary | ICD-10-CM | POA: Diagnosis not present

## 2016-10-31 DIAGNOSIS — I10 Essential (primary) hypertension: Secondary | ICD-10-CM

## 2016-10-31 DIAGNOSIS — E782 Mixed hyperlipidemia: Secondary | ICD-10-CM

## 2016-10-31 NOTE — Progress Notes (Signed)
MRN : [id number]  Sherri Dawson is a 66 y.o. (01-24-1951) female who presents with chief complaint of  Chief Complaint  Patient presents with  . Re-evaluation    ABI follow up ultrasound  .  History of Present Illness: The patient returns to the office for followup and review of the noninvasive studies. There have been no interval changes in lower extremity symptoms. No interval shortening of the patient's claudication distance or development of rest pain symptoms. No new ulcers or wounds have occurred since the last visit.  There have been no significant changes to the patient's overall health care.  The patient denies amaurosis fugax or recent TIA symptoms. There are no recent neurological changes noted. The patient denies history of DVT, PE or superficial thrombophlebitis. The patient denies recent episodes of angina or shortness of breath.   ABI Rt=1.19 and Lt=1.24  Toe tracings are damped on the right and flat on the left (previous ABI's Rt=1.06 and Lt=0.99) Duplex ultrasound of the left leg arterial shows a widely patent stent with uniform velocities  Current Meds  Medication Sig  . aspirin EC 81 MG tablet Take by mouth.  . clopidogrel (PLAVIX) 75 MG tablet Take 1 tablet (75 mg total) by mouth daily.  Marland Kitchen ibuprofen (ADVIL,MOTRIN) 200 MG tablet Take 400 mg by mouth every 6 (six) hours as needed for headache or moderate pain.  Marland Kitchen olmesartan-hydrochlorothiazide (BENICAR HCT) 20-12.5 MG tablet Take by mouth.  . simvastatin (ZOCOR) 40 MG tablet Take by mouth.    No past medical history on file.  Past Surgical History:  Procedure Laterality Date  . PERIPHERAL VASCULAR CATHETERIZATION Left 06/19/2016   Procedure: Lower Extremity Angiography;  Surgeon: Katha Cabal, MD;  Location: [facility];  Service: Cardiovascular;  Laterality: Left;    Social History Social History  Substance Use Topics  . Smoking status: Former Smoker    Packs/day: 0.50    Years:  45.00    Types: Cigarettes    Quit date: 06/18/2016  . Smokeless tobacco: Never Used  . Alcohol use Yes    Family History No family history on file. No family history of bleeding/clotting disorders, porphyria or autoimmune disease   No Known Allergies   REVIEW OF SYSTEMS (Negative unless checked)  Constitutional: [] Weight loss  [] Fever  [] Chills Cardiac: [] Chest pain   [] Chest pressure   [] Palpitations   [] Shortness of breath when laying flat   [] Shortness of breath with exertion. Vascular:  [] Pain in legs with walking   [] Pain in legs at rest  [] History of DVT   [] Phlebitis   [] Swelling in legs   [] Varicose veins   [] Non-healing ulcers Pulmonary:   [] Uses home oxygen   [] Productive cough   [] Hemoptysis   [] Wheeze  [] COPD   [] Asthma Neurologic:  [] Dizziness   [] Seizures   [] History of stroke   [] History of TIA  [] Aphasia   [] Vissual changes   [] Weakness or numbness in arm   [] Weakness or numbness in leg Musculoskeletal:   [] Joint swelling   [] Joint pain   [] Low back pain Hematologic:  [] Easy bruising  [] Easy bleeding   [] Hypercoagulable state   [] Anemic Gastrointestinal:  [] Diarrhea   [] Vomiting  [] Gastroesophageal reflux/heartburn   [] Difficulty swallowing. Genitourinary:  [] Chronic kidney disease   [] Difficult urination  [] Frequent urination   [] Blood in urine Skin:  [] Rashes   [] Ulcers  Psychological:  [] History of anxiety   []  History of major depression.  Physical Examination  Vitals:   10/31/16  1529  BP: (!) 149/78  Pulse: 72  Resp: 16  Weight: 158 lb (71.7 kg)  Height: 5\' 4"  (1.626 m)   Body mass index is 27.12 kg/m. Gen: WD/WN, NAD Head: [place]/AT, No temporalis wasting.  Ear/Nose/Throat: Hearing grossly intact, nares w/o erythema or drainage, poor dentition Eyes: PER, EOMI, sclera nonicteric.  Neck: Supple, no masses.  No bruit or JVD.  Pulmonary:  Good air movement, clear to auscultation bilaterally, no use of accessory muscles.  Cardiac: RRR, normal S1, S2, no  Murmurs. Vascular: feet cool to touch with 2-3 second cap refill Vessel Right Left  Radial Palpable Palpable  Ulnar Palpable Palpable  Brachial Palpable Palpable  Carotid Palpable Palpable  Femoral Palpable Palpable  Popliteal Palpable Palpable  PT Palpable Palpable  DP Palpable Trace Palpable   Gastrointestinal: soft, non-distended. No guarding/no peritoneal signs.  Musculoskeletal: M/S 5/5 throughout.  No deformity or atrophy.  Neurologic: CN 2-12 intact. Pain and light touch intact in extremities.  Symmetrical.  Speech is fluent. Motor exam as listed above. Psychiatric: Judgment intact, Mood & affect appropriate for pt's clinical situation. Dermatologic: No rashes or ulcers noted.  No changes consistent with cellulitis. Lymph : No Cervical lymphadenopathy, no lichenification or skin changes of chronic lymphedema.  CBC No results found for: WBC, HGB, HCT, MCV, PLT  BMET    Component Value Date/Time   BUN 13 06/18/2016 0914   CREATININE 0.86 06/18/2016 0914   GFRNONAA >60 06/18/2016 0914   GFRAA >60 06/18/2016 0914   CrCl cannot be calculated (Patient's most recent lab result is older than the maximum 21 days allowed.).  COAG No results found for: INR, PROTIME  Radiology No results found.  Assessment/Plan 1. 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  The patient does not voice lifestyle limiting changes at this point in time.  Noninvasive studies do not suggest clinically significant change.  No invasive studies, angiography or surgery at this time The patient should continue walking and begin a more formal exercise program.  The patient should continue antiplatelet therapy and aggressive treatment of the lipid abnormalities  No changes in the patient's medications at this time  The patient should continue wearing graduated  compression socks 10-15 mmHg strength to control the mild edema.   - VAS [place] ABI WITH/WO TBI; Future - VAS [place] LOWER EXTREMITY ARTERIAL DUPLEX; Future  2. 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 Essential hypertension Continue antihypertensive medications as already ordered, these medications have been reviewed and there are no changes at this time.   4. Mixed hyperlipidemia Continue statin as ordered and reviewed, no changes at this time   5. Pain of left lower extremity Rest pain has resolved    Hortencia Pilar, MD  10/31/2016 3:55 PM

## 2017-01-17 DIAGNOSIS — E782 Mixed hyperlipidemia: Secondary | ICD-10-CM | POA: Diagnosis not present

## 2017-01-17 DIAGNOSIS — E538 Deficiency of other specified B group vitamins: Secondary | ICD-10-CM | POA: Diagnosis not present

## 2017-01-17 DIAGNOSIS — R739 Hyperglycemia, unspecified: Secondary | ICD-10-CM | POA: Diagnosis not present

## 2017-01-24 DIAGNOSIS — E538 Deficiency of other specified B group vitamins: Secondary | ICD-10-CM | POA: Diagnosis not present

## 2017-01-24 DIAGNOSIS — E119 Type 2 diabetes mellitus without complications: Secondary | ICD-10-CM | POA: Diagnosis not present

## 2017-01-24 DIAGNOSIS — E782 Mixed hyperlipidemia: Secondary | ICD-10-CM | POA: Diagnosis not present

## 2017-05-05 ENCOUNTER — Ambulatory Visit (INDEPENDENT_AMBULATORY_CARE_PROVIDER_SITE_OTHER): Payer: Medicare HMO

## 2017-05-05 ENCOUNTER — Encounter (INDEPENDENT_AMBULATORY_CARE_PROVIDER_SITE_OTHER): Payer: Self-pay | Admitting: Vascular Surgery

## 2017-05-05 ENCOUNTER — Ambulatory Visit (INDEPENDENT_AMBULATORY_CARE_PROVIDER_SITE_OTHER): Payer: Medicare HMO | Admitting: Vascular Surgery

## 2017-05-05 VITALS — BP 166/96 | HR 75 | Resp 16 | Wt 155.0 lb

## 2017-05-05 DIAGNOSIS — I10 Essential (primary) hypertension: Secondary | ICD-10-CM

## 2017-05-05 DIAGNOSIS — I70213 Atherosclerosis of native arteries of extremities with intermittent claudication, bilateral legs: Secondary | ICD-10-CM

## 2017-05-05 DIAGNOSIS — I70219 Atherosclerosis of native arteries of extremities with intermittent claudication, unspecified extremity: Secondary | ICD-10-CM

## 2017-05-05 DIAGNOSIS — E782 Mixed hyperlipidemia: Secondary | ICD-10-CM | POA: Diagnosis not present

## 2017-05-06 NOTE — Progress Notes (Signed)
MRN : [id number]  Sherri Dawson is a 66 y.o. ([birth year]) female who presents with chief complaint of  Chief Complaint  Patient presents with  . Follow-up    20mo abi,lle art  .  History of Present Illness: The patient returns to the office for followup and review of the noninvasive studies. There have been no interval changes in lower extremity symptoms. No interval shortening of the patient's claudication distance or development of rest pain symptoms. No new ulcers or wounds have occurred since the last visit.  There have been no significant changes to the patient's overall health care.  The patient denies amaurosis fugax or recent TIA symptoms. There are no recent neurological changes noted. The patient denies history of DVT, PE or superficial thrombophlebitis. The patient denies recent episodes of angina or shortness of breath.   ABI Rt=1.15 and Lt=1.24  (previous ABI's Rt=1.19 and Lt=1.24) Duplex ultrasound of the left leg arterial show sfa and stent are widely patent  Current Meds  Medication Sig  . aspirin EC 81 MG tablet Take by mouth.  Marland Kitchen ibuprofen (ADVIL,MOTRIN) 200 MG tablet Take 400 mg by mouth every 6 (six) hours as needed for headache or moderate pain.  Marland Kitchen olmesartan-hydrochlorothiazide (BENICAR HCT) 20-12.5 MG tablet Take by mouth.  . simvastatin (ZOCOR) 40 MG tablet Take by mouth.    Past Medical History:  Diagnosis Date  . Hypertension     Past Surgical History:  Procedure Laterality Date  . PERIPHERAL VASCULAR CATHETERIZATION Left 06/19/2016   Procedure: Lower Extremity Angiography;  Surgeon: Katha Cabal, MD;  Location: [facility];  Service: Cardiovascular;  Laterality: Left;    Social History Social History  Substance Use Topics  . Smoking status: Former Smoker    Packs/day: 0.50    Years: 45.00    Types: Cigarettes    Quit date: 06/18/2016  . Smokeless tobacco: Never Used  . Alcohol use Yes    Family History Family History    Problem Relation Age of Onset  . Heart attack Father   . Breast cancer Maternal Aunt     No Known Allergies   REVIEW OF SYSTEMS (Negative unless checked)  Constitutional: [] Weight loss  [] Fever  [] Chills Cardiac: [] Chest pain   [] Chest pressure   [] Palpitations   [] Shortness of breath when laying flat   [] Shortness of breath with exertion. Vascular:  [x] Pain in legs with walking   [] Pain in legs at rest  [] History of DVT   [] Phlebitis   [] Swelling in legs   [] Varicose veins   [] Non-healing ulcers Pulmonary:   [] Uses home oxygen   [] Productive cough   [] Hemoptysis   [] Wheeze  [] COPD   [] Asthma Neurologic:  [] Dizziness   [] Seizures   [] History of stroke   [] History of TIA  [] Aphasia   [] Vissual changes   [] Weakness or numbness in arm   [] Weakness or numbness in leg Musculoskeletal:   [] Joint swelling   [] Joint pain   [] Low back pain Hematologic:  [] Easy bruising  [] Easy bleeding   [] Hypercoagulable state   [] Anemic Gastrointestinal:  [] Diarrhea   [] Vomiting  [] Gastroesophageal reflux/heartburn   [] Difficulty swallowing. Genitourinary:  [] Chronic kidney disease   [] Difficult urination  [] Frequent urination   [] Blood in urine Skin:  [] Rashes   [] Ulcers  Psychological:  [] History of anxiety   []  History of major depression.  Physical Examination  Vitals:   05/05/17 1126  BP: (!) 166/96  Pulse: 75  Resp: 16  Weight: 70.3 kg (155  lb)   Body mass index is 26.61 kg/m. Gen: WD/WN, NAD Head: /AT, No temporalis wasting.  Ear/Nose/Throat: Hearing grossly intact, nares w/o erythema or drainage Eyes: PER, EOMI, sclera nonicteric.  Neck: Supple, no large masses.   Pulmonary:  Good air movement, no audible wheezing bilaterally, no use of accessory muscles.  Cardiac: RRR, no JVD Vascular:  Vessel Right Left  Radial Palpable Palpable  PT Palpable Palpable  DP Palpable Palpable  Gastrointestinal: Non-distended. No guarding/no peritoneal signs.  Musculoskeletal: M/S 5/5 throughout.  No  deformity or atrophy.  Neurologic: CN 2-12 intact. Symmetrical.  Speech is fluent. Motor exam as listed above. Psychiatric: Judgment intact, Mood & affect appropriate for pt's clinical situation. Dermatologic: No rashes or ulcers noted.  No changes consistent with cellulitis. Lymph : No lichenification or skin changes of chronic lymphedema.  CBC No results found for: WBC, HGB, HCT, MCV, PLT  BMET    Component Value Date/Time   BUN 13 06/18/2016 0914   CREATININE 0.86 06/18/2016 0914   GFRNONAA >60 06/18/2016 0914   GFRAA >60 06/18/2016 0914   CrCl cannot be calculated (Patient's most recent lab result is older than the maximum 21 days allowed.).  COAG No results found for: INR, PROTIME  Radiology No results found.  Assessment/Plan 1. Atherosclerosis of native artery of both lower extremities with intermittent claudication (HCC)  Recommend:  The patient has evidence of atherosclerosis of the lower extremities with claudication.  The patient does not voice lifestyle limiting changes at this point in time.  Noninvasive studies do not suggest clinically significant change.  No invasive studies, angiography or surgery at this time The patient should continue walking and begin a more formal exercise program.  The patient should continue antiplatelet therapy and aggressive treatment of the lipid abnormalities  No changes in the patient's medications at this time  The patient should continue wearing graduated compression socks 10-15 mmHg strength to control the mild edema.   - VAS [place] LOWER EXTREMITY ARTERIAL DUPLEX; Future - VAS [place] ABI WITH/WO TBI; Future  2. Essential hypertension Continue antihypertensive medications as already ordered, these medications have been reviewed and there are no changes at this time.   3. Mixed hyperlipidemia Continue statin as ordered and reviewed, no changes at this time     Hortencia Pilar, MD  05/06/2017 3:21 PM

## 2017-07-15 DIAGNOSIS — E782 Mixed hyperlipidemia: Secondary | ICD-10-CM | POA: Diagnosis not present

## 2017-07-15 DIAGNOSIS — E119 Type 2 diabetes mellitus without complications: Secondary | ICD-10-CM | POA: Diagnosis not present

## 2017-07-15 DIAGNOSIS — E538 Deficiency of other specified B group vitamins: Secondary | ICD-10-CM | POA: Diagnosis not present

## 2017-07-22 DIAGNOSIS — E1151 Type 2 diabetes mellitus with diabetic peripheral angiopathy without gangrene: Secondary | ICD-10-CM | POA: Diagnosis not present

## 2017-07-22 DIAGNOSIS — E538 Deficiency of other specified B group vitamins: Secondary | ICD-10-CM | POA: Diagnosis not present

## 2017-07-22 DIAGNOSIS — Z0000 Encounter for general adult medical examination without abnormal findings: Secondary | ICD-10-CM | POA: Diagnosis not present

## 2017-07-22 DIAGNOSIS — Z23 Encounter for immunization: Secondary | ICD-10-CM | POA: Diagnosis not present

## 2018-01-20 DIAGNOSIS — E039 Hypothyroidism, unspecified: Secondary | ICD-10-CM | POA: Insufficient documentation

## 2018-01-30 ENCOUNTER — Ambulatory Visit: Admit: 2018-01-30 | Payer: Commercial Managed Care - HMO | Admitting: Unknown Physician Specialty

## 2018-02-10 ENCOUNTER — Ambulatory Visit (INDEPENDENT_AMBULATORY_CARE_PROVIDER_SITE_OTHER): Payer: Medicare HMO | Admitting: Vascular Surgery

## 2018-02-10 ENCOUNTER — Ambulatory Visit (INDEPENDENT_AMBULATORY_CARE_PROVIDER_SITE_OTHER): Payer: Medicare HMO

## 2018-02-10 ENCOUNTER — Encounter (INDEPENDENT_AMBULATORY_CARE_PROVIDER_SITE_OTHER): Payer: Self-pay

## 2018-02-10 ENCOUNTER — Encounter (INDEPENDENT_AMBULATORY_CARE_PROVIDER_SITE_OTHER): Payer: Self-pay | Admitting: Vascular Surgery

## 2018-02-10 VITALS — BP 169/100 | HR 85 | Resp 13 | Ht 64.0 in | Wt 162.0 lb

## 2018-02-10 DIAGNOSIS — I743 Embolism and thrombosis of arteries of the lower extremities: Secondary | ICD-10-CM

## 2018-02-10 DIAGNOSIS — F17200 Nicotine dependence, unspecified, uncomplicated: Secondary | ICD-10-CM

## 2018-02-10 DIAGNOSIS — E782 Mixed hyperlipidemia: Secondary | ICD-10-CM | POA: Diagnosis not present

## 2018-02-10 MED ORDER — OXYCODONE-ACETAMINOPHEN 5-325 MG PO TABS: 1.0000 | ORAL_TABLET | ORAL | 0 refills | Status: DC | PRN

## 2018-02-10 NOTE — Progress Notes (Signed)
Subjective:    Patient ID: Sherri Dawson, female    DOB: 08/09/1951, 67 y.o.   MRN: [id number] Chief Complaint  Patient presents with  . Follow-up    Leg pain in Left calf   Patient last seen on 05/05/2017.  The patient is status post a left SFA stent on 06/19/2016.  The patient presents today with a chief complaint of acute left calf claudication.  The patient notes that she was walking in the supermarket yesterday when she experienced an acute pain to her left calf.  This pain has now persisted into today which prompted her to call the office to make an appointment.  The patient denies any rest pain or ulcer formation to the left lower extremity. The patient continues to take daily the aspirin.  The patient notes that she has not taken Plavix in quite some time.  The patient underwent a stat lower extremity arterial duplex study to the left lower extremity which was notable for triphasic blood flow transitioning to an occluded mid to distal SFA with monophasic blood flow from the popliteal distal to the tibials.  When compared to the previous duplex on May 01, 2017 there has been significant progression of the left lower extremity arterial disease.  Denies any fever, nausea vomiting.  Review of Systems  Constitutional: Negative.   HENT: Negative.   Eyes: Negative.   Respiratory: Negative.   Cardiovascular:       Left lower extremity claudication  Gastrointestinal: Negative.   Endocrine: Negative.   Genitourinary: Negative.   Musculoskeletal: Negative.   Skin: Negative.   Allergic/Immunologic: Negative.   Neurological: Negative.   Hematological: Negative.   Psychiatric/Behavioral: Negative.       Objective:   Physical Exam  Constitutional: She is oriented to person, place, and time. She appears well-developed and well-nourished. No distress.  HENT:  Head: Normocephalic and atraumatic.  Right Ear: External ear normal.  Left Ear: External ear normal.  Eyes: Pupils are  equal, round, and reactive to light. Conjunctivae are normal.  Neck: Normal range of motion.  Cardiovascular: Normal rate, regular rhythm, normal heart sounds and intact distal pulses.  Pulses:      Radial pulses are 2+ on the right side, and 2+ on the left side.       Dorsalis pedis pulses are 2+ on the right side.       Posterior tibial pulses are 2+ on the right side.  Hard to palpate left pedal pulses however the left foot is relatively warm.  The left foot starts to become cooler approximately midfoot towards the toes.  Skin is intact.  There is no bluish tint to the toes.  There is no cellulitis.  Pulmonary/Chest: Effort normal and breath sounds normal.  Musculoskeletal: Normal range of motion. She exhibits no edema.  Neurological: She is alert and oriented to person, place, and time.  Skin: Skin is warm and dry. She is not diaphoretic.  Psychiatric: She has a normal mood and affect. Her behavior is normal. Judgment and thought content normal.  Vitals reviewed.  BP (!) 169/100 (BP Location: Right Arm, Patient Position: Sitting)   Pulse 85   Resp 13   Ht 5\' 4"  (1.626 m)   Wt 162 lb (73.5 kg)   BMI 27.81 kg/m   Past Medical History:  Diagnosis Date  . Hypertension    Social History   Socioeconomic History  . Marital status: Married    Spouse name: Not on file  . Number  of children: Not on file  . Years of education: Not on file  . Highest education level: Not on file  Occupational History  . Not on file  Social Needs  . Financial resource strain: Not on file  . Food insecurity:    Worry: Not on file    Inability: Not on file  . Transportation needs:    Medical: Not on file    Non-medical: Not on file  Tobacco Use  . Smoking status: Former Smoker    Packs/day: 0.50    Years: 45.00    Pack years: 22.50    Types: Cigarettes    Last attempt to quit: 06/18/2016    Years since quitting: 1.6  . Smokeless tobacco: Never Used  Substance and Sexual Activity  . Alcohol  use: Yes  . Drug use: No  . Sexual activity: Not on file  Lifestyle  . Physical activity:    Days per week: Not on file    Minutes per session: Not on file  . Stress: Not on file  Relationships  . Social connections:    Talks on phone: Not on file    Gets together: Not on file    Attends religious service: Not on file    Active member of club or organization: Not on file    Attends meetings of clubs or organizations: Not on file    Relationship status: Not on file  . Intimate partner violence:    Fear of current or ex partner: Not on file    Emotionally abused: Not on file    Physically abused: Not on file    Forced sexual activity: Not on file  Other Topics Concern  . Not on file  Social History Narrative  . Not on file   Past Surgical History:  Procedure Laterality Date  . PERIPHERAL VASCULAR CATHETERIZATION Left 06/19/2016   Procedure: Lower Extremity Angiography;  Surgeon: Katha Cabal, MD;  Location: [facility];  Service: Cardiovascular;  Laterality: Left;   Family History  Problem Relation Age of Onset  . Heart attack Father   . Breast cancer Maternal Aunt    No Known Allergies     Assessment & Plan:  Patient last seen on 05/05/2017.  The patient is status post a left SFA stent on 06/19/2016.  The patient presents today with a chief complaint of acute left calf claudication.  The patient notes that she was walking in the supermarket yesterday when she experienced an acute pain to her left calf.  This pain has now persisted into today which prompted her to call the office to make an appointment.  The patient denies any rest pain or ulcer formation to the left lower extremity. The patient continues to take daily the aspirin.  The patient notes that she has not taken Plavix in quite some time.  The patient underwent a stat lower extremity arterial duplex study to the left lower extremity which was notable for triphasic blood flow transitioning to an occluded mid  to distal SFA with monophasic blood flow from the popliteal distal to the tibials.  When compared to the previous duplex on May 01, 2017 there has been significant progression of the left lower extremity arterial disease.  Denies any fever, nausea vomiting.  1. 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) - Stable Patient presents today with an acute worsening of her left lower extremity calf claudication x1 day Arterial duplex of the left lower extremity shows an occluded  mid to distal SFA stent with monophasic blood flow from the popliteal/tibial arteries distally Recommend a left lower extremity angiogram with possible intervention in an attempt to assess the patient's anatomy and revascularize the leg Procedure, risks and benefits explained to the patient All questions answered The patient wishes to proceed Percocet 5/325 mg 1 tab every 4-6 hours as needed for pain #60  - VAS [place] LOWER EXTREMITY ARTERIAL DUPLEX; Future  2. Tobacco dependency - Stable We had a discussion for approximately five minutes regarding the absolute need for smoking cessation due to the deleterious nature of tobacco on the vascular system. We discussed the tobacco use would diminish patency of any intervention, and likely significantly worsen progressio of disease. We discussed multiple agents for quitting including replacement therapy or medications to reduce cravings such as Chantix. The patient voices their understanding of the importance of smoking cessation.  3. Mixed hyperlipidemia - Stable Encouraged good control as its slows the progression of atherosclerotic disease  Current Outpatient Medications on File Prior to Visit  Medication Sig Dispense Refill  . aspirin EC 81 MG tablet Take by mouth.    . clopidogrel (PLAVIX) 75 MG tablet Take 1 tablet (75 mg total) by mouth daily. 30 tablet 4  . ibuprofen (ADVIL,MOTRIN) 200 MG tablet Take 400 mg by mouth every 6 (six) hours as needed for  headache or moderate pain.    . metFORMIN (GLUCOPHAGE) 500 MG tablet TAKE 1 TABLET BY MOUTH BREAKFAST  2  . olmesartan-hydrochlorothiazide (BENICAR HCT) 20-12.5 MG tablet Take by mouth.    . simvastatin (ZOCOR) 40 MG tablet Take by mouth.     No current facility-administered medications on file prior to visit.    There are no Patient Instructions on file for this visit. No follow-ups on file.  Anistyn Graddy A Charmain Diosdado, PA-C

## 2018-02-16 ENCOUNTER — Other Ambulatory Visit (INDEPENDENT_AMBULATORY_CARE_PROVIDER_SITE_OTHER): Payer: Self-pay | Admitting: Vascular Surgery

## 2018-02-17 ENCOUNTER — Encounter: Payer: Self-pay | Admitting: Anesthesiology

## 2018-02-17 ENCOUNTER — Ambulatory Visit
Admission: RE | Admit: 2018-02-17 | Discharge: 2018-02-17 | Disposition: A | Discharge status: Home / Self Care | Payer: Medicare HMO | Source: Ambulatory Visit | Attending: Vascular Surgery | Admitting: Vascular Surgery

## 2018-02-17 ENCOUNTER — Encounter
Admission: RE | Disposition: A | Discharge status: Home / Self Care | Payer: Self-pay | Source: Ambulatory Visit | Attending: Vascular Surgery

## 2018-02-17 DIAGNOSIS — T82897A Other specified complication of cardiac prosthetic devices, implants and grafts, initial encounter: Secondary | ICD-10-CM

## 2018-02-17 DIAGNOSIS — I70399 Other atherosclerosis of unspecified type of bypass graft(s) of the extremities, unspecified extremity: Secondary | ICD-10-CM | POA: Diagnosis present

## 2018-02-17 DIAGNOSIS — Z538 Procedure and treatment not carried out for other reasons: Secondary | ICD-10-CM | POA: Insufficient documentation

## 2018-02-17 HISTORY — DX: Hyperlipidemia, unspecified: E78.5

## 2018-02-17 LAB — CREATININE, SERUM
Creatinine, Ser: 0.86 mg/dL (ref 0.44–1.00)
GFR calc Af Amer: >60 mL/min (ref >60)
GFR calc non Af Amer: >60 mL/min (ref >60)

## 2018-02-17 LAB — BUN: BUN: 14 mg/dL (ref 8–23)

## 2018-02-17 LAB — GLUCOSE, CAPILLARY: Glucose-Capillary: 81 mg/dL (ref 70–99)

## 2018-02-17 SURGERY — LOWER EXTREMITY ANGIOGRAPHY
Anesthesia: Moderate Sedation | Laterality: Left

## 2018-02-17 MED ORDER — METHYLPREDNISOLONE SODIUM SUCC 125 MG IJ SOLR: 125.0000 mg | INTRAMUSCULAR | Status: DC | PRN

## 2018-02-17 MED ORDER — HYDROMORPHONE HCL 1 MG/ML IJ SOLN: 1.0000 mg | Freq: Once | INTRAMUSCULAR | Status: DC | PRN

## 2018-02-17 MED ORDER — FAMOTIDINE 20 MG PO TABS: 40.0000 mg | ORAL_TABLET | ORAL | Status: DC | PRN

## 2018-02-17 MED ORDER — ONDANSETRON HCL 4 MG/2ML IJ SOLN: 4.0000 mg | Freq: Four times a day (QID) | INTRAMUSCULAR | Status: DC | PRN

## 2018-02-17 MED ORDER — SODIUM CHLORIDE 0.9 % IV SOLN
INTRAVENOUS | Status: DC
  Administered 2018-02-17: 14:00:00 via INTRAVENOUS

## 2018-02-17 MED ORDER — CEFAZOLIN SODIUM-DEXTROSE 2-4 GM/100ML-% IV SOLN: 2.0000 g | Freq: Once | INTRAVENOUS | Status: DC

## 2018-02-17 NOTE — Progress Notes (Signed)
Patient to be rescheduled due to an emergency case being added on. Office called and message left to get the patient rescheduled.

## 2018-02-18 ENCOUNTER — Encounter (INDEPENDENT_AMBULATORY_CARE_PROVIDER_SITE_OTHER): Payer: Self-pay

## 2018-02-18 ENCOUNTER — Other Ambulatory Visit (INDEPENDENT_AMBULATORY_CARE_PROVIDER_SITE_OTHER): Payer: Self-pay | Admitting: Vascular Surgery

## 2018-02-25 ENCOUNTER — Ambulatory Visit
Admission: RE | Admit: 2018-02-25 | Discharge: 2018-02-25 | Disposition: A | Discharge status: Home / Self Care | Payer: Medicare HMO | Source: Ambulatory Visit | Attending: Vascular Surgery | Admitting: Vascular Surgery

## 2018-02-25 ENCOUNTER — Telehealth (INDEPENDENT_AMBULATORY_CARE_PROVIDER_SITE_OTHER): Payer: Self-pay | Admitting: Vascular Surgery

## 2018-02-25 ENCOUNTER — Encounter
Admission: RE | Disposition: A | Discharge status: Home / Self Care | Payer: Self-pay | Source: Ambulatory Visit | Attending: Vascular Surgery

## 2018-02-25 DIAGNOSIS — Z79899 Other long term (current) drug therapy: Secondary | ICD-10-CM | POA: Insufficient documentation

## 2018-02-25 DIAGNOSIS — Z7901 Long term (current) use of anticoagulants: Secondary | ICD-10-CM | POA: Insufficient documentation

## 2018-02-25 DIAGNOSIS — I743 Embolism and thrombosis of arteries of the lower extremities: Secondary | ICD-10-CM | POA: Insufficient documentation

## 2018-02-25 DIAGNOSIS — I70212 Atherosclerosis of native arteries of extremities with intermittent claudication, left leg: Secondary | ICD-10-CM | POA: Diagnosis present

## 2018-02-25 DIAGNOSIS — E782 Mixed hyperlipidemia: Secondary | ICD-10-CM | POA: Insufficient documentation

## 2018-02-25 DIAGNOSIS — I70219 Atherosclerosis of native arteries of extremities with intermittent claudication, unspecified extremity: Secondary | ICD-10-CM

## 2018-02-25 DIAGNOSIS — Z8249 Family history of ischemic heart disease and other diseases of the circulatory system: Secondary | ICD-10-CM | POA: Diagnosis not present

## 2018-02-25 DIAGNOSIS — I10 Essential (primary) hypertension: Secondary | ICD-10-CM | POA: Insufficient documentation

## 2018-02-25 DIAGNOSIS — Z9889 Other specified postprocedural states: Secondary | ICD-10-CM | POA: Diagnosis not present

## 2018-02-25 DIAGNOSIS — F17200 Nicotine dependence, unspecified, uncomplicated: Secondary | ICD-10-CM | POA: Insufficient documentation

## 2018-02-25 DIAGNOSIS — Z7984 Long term (current) use of oral hypoglycemic drugs: Secondary | ICD-10-CM | POA: Insufficient documentation

## 2018-02-25 DIAGNOSIS — Z7982 Long term (current) use of aspirin: Secondary | ICD-10-CM | POA: Insufficient documentation

## 2018-02-25 HISTORY — PX: LOWER EXTREMITY ANGIOGRAPHY: CATH118251

## 2018-02-25 LAB — GLUCOSE, CAPILLARY
Glucose-Capillary: 142 mg/dL — ABNORMAL HIGH (ref 70–99)
Glucose-Capillary: 119 mg/dL — ABNORMAL HIGH (ref 70–99)

## 2018-02-25 LAB — BUN: BUN: 18 mg/dL (ref 8–23)

## 2018-02-25 LAB — CREATININE, SERUM
Creatinine, Ser: 1.05 mg/dL — ABNORMAL HIGH (ref 0.44–1.00)
GFR calc Af Amer: >60 mL/min (ref >60)
GFR calc non Af Amer: 54 mL/min — ABNORMAL LOW (ref >60)

## 2018-02-25 SURGERY — LOWER EXTREMITY ANGIOGRAPHY
Anesthesia: Moderate Sedation | Laterality: Left

## 2018-02-25 MED ORDER — ONDANSETRON HCL 4 MG/2ML IJ SOLN
4.0000 mg | Freq: Four times a day (QID) | INTRAMUSCULAR | Status: DC | PRN
Start: 2018-02-25 — End: 2018-02-25

## 2018-02-25 MED ORDER — LIDOCAINE HCL (PF) 1 % IJ SOLN
INTRAMUSCULAR | Status: AC
  Filled 2018-02-25: qty 30

## 2018-02-25 MED ORDER — HYDROMORPHONE HCL 1 MG/ML IJ SOLN
INTRAMUSCULAR | Status: AC
  Filled 2018-02-25: qty 0.5

## 2018-02-25 MED ORDER — ONDANSETRON HCL 4 MG/2ML IJ SOLN: 4.0000 mg | Freq: Four times a day (QID) | INTRAMUSCULAR | Status: DC | PRN

## 2018-02-25 MED ORDER — MORPHINE SULFATE (PF) 4 MG/ML IV SOLN: 2.0000 mg | INTRAVENOUS | Status: DC | PRN

## 2018-02-25 MED ORDER — NITROGLYCERIN 5 MG/ML IV SOLN
INTRAVENOUS | Status: AC
  Filled 2018-02-25: qty 10

## 2018-02-25 MED ORDER — SODIUM CHLORIDE 0.9 % IJ SOLN
INTRAMUSCULAR | Status: AC
  Filled 2018-02-25: qty 50

## 2018-02-25 MED ORDER — IOPAMIDOL (ISOVUE-300) INJECTION 61%
INTRAVENOUS | Status: DC | PRN
  Administered 2018-02-25: 90 mL via INTRA_ARTERIAL

## 2018-02-25 MED ORDER — MIDAZOLAM HCL 5 MG/5ML IJ SOLN
INTRAMUSCULAR | Status: AC
  Filled 2018-02-25: qty 5

## 2018-02-25 MED ORDER — ONDANSETRON 4 MG PO TBDP
ORAL_TABLET | ORAL | Status: AC
  Filled 2018-02-25: qty 1

## 2018-02-25 MED ORDER — METHYLPREDNISOLONE SODIUM SUCC 125 MG IJ SOLR: 125.0000 mg | INTRAMUSCULAR | Status: DC | PRN

## 2018-02-25 MED ORDER — OXYCODONE HCL 5 MG PO TABS
5.0000 mg | ORAL_TABLET | ORAL | Status: DC | PRN
  Administered 2018-02-25: 5 mg via ORAL

## 2018-02-25 MED ORDER — FAMOTIDINE 20 MG PO TABS: 40.0000 mg | ORAL_TABLET | ORAL | Status: DC | PRN

## 2018-02-25 MED ORDER — FENTANYL CITRATE (PF) 100 MCG/2ML IJ SOLN
INTRAMUSCULAR | Status: AC
  Filled 2018-02-25: qty 2

## 2018-02-25 MED ORDER — CLOPIDOGREL BISULFATE 75 MG PO TABS
ORAL_TABLET | ORAL | Status: AC
  Administered 2018-02-25: 300 mg via ORAL
  Filled 2018-02-25: qty 4

## 2018-02-25 MED ORDER — HEPARIN SODIUM (PORCINE) 1000 UNIT/ML IJ SOLN
INTRAMUSCULAR | Status: DC | PRN
  Administered 2018-02-25: 5000 [IU] via INTRAVENOUS

## 2018-02-25 MED ORDER — SODIUM CHLORIDE 0.9 % IV SOLN: INTRAVENOUS | Status: DC

## 2018-02-25 MED ORDER — HEPARIN SODIUM (PORCINE) 1000 UNIT/ML IJ SOLN
INTRAMUSCULAR | Status: AC
  Filled 2018-02-25: qty 1

## 2018-02-25 MED ORDER — ONDANSETRON 4 MG PO TBDP
4.0000 mg | ORAL_TABLET | Freq: Once | ORAL | Status: AC
  Administered 2018-02-25: 4 mg via ORAL

## 2018-02-25 MED ORDER — HEPARIN (PORCINE) IN NACL 1000-0.9 UT/500ML-% IV SOLN
INTRAVENOUS | Status: AC
  Filled 2018-02-25: qty 1000

## 2018-02-25 MED ORDER — MIDAZOLAM HCL 2 MG/2ML IJ SOLN
INTRAMUSCULAR | Status: DC | PRN
  Administered 2018-02-25: 2 mg via INTRAVENOUS
  Administered 2018-02-25 (×4): 1 mg via INTRAVENOUS

## 2018-02-25 MED ORDER — SODIUM CHLORIDE 0.9% FLUSH: 3.0000 mL | Freq: Two times a day (BID) | INTRAVENOUS | Status: DC

## 2018-02-25 MED ORDER — LIDOCAINE-EPINEPHRINE (PF) 1 %-1:200000 IJ SOLN
INTRAMUSCULAR | Status: AC
  Filled 2018-02-25: qty 30

## 2018-02-25 MED ORDER — CLOPIDOGREL BISULFATE 75 MG PO TABS: 75.0000 mg | ORAL_TABLET | Freq: Every day | ORAL | 4 refills | Status: DC

## 2018-02-25 MED ORDER — FENTANYL CITRATE (PF) 100 MCG/2ML IJ SOLN
INTRAMUSCULAR | Status: DC | PRN
  Administered 2018-02-25 (×2): 25 ug via INTRAVENOUS
  Administered 2018-02-25 (×4): 50 ug via INTRAVENOUS

## 2018-02-25 MED ORDER — OXYCODONE HCL 5 MG PO TABS
ORAL_TABLET | ORAL | Status: AC
  Filled 2018-02-25: qty 1

## 2018-02-25 MED ORDER — SODIUM CHLORIDE 0.9 % IV SOLN
INTRAVENOUS | Status: AC | PRN
  Administered 2018-02-25: 250 mL via INTRAVENOUS

## 2018-02-25 MED ORDER — NITROGLYCERIN 1 MG/10 ML FOR IR/CATH LAB
INTRA_ARTERIAL | Status: DC | PRN
  Administered 2018-02-25: 250 ug
  Administered 2018-02-25 (×2): 500 ug

## 2018-02-25 MED ORDER — LABETALOL HCL 5 MG/ML IV SOLN: 10.0000 mg | INTRAVENOUS | Status: DC | PRN

## 2018-02-25 MED ORDER — MIDAZOLAM HCL 2 MG/2ML IJ SOLN
INTRAMUSCULAR | Status: AC
  Filled 2018-02-25: qty 2

## 2018-02-25 MED ORDER — SODIUM CHLORIDE 0.9% FLUSH: 3.0000 mL | INTRAVENOUS | Status: DC | PRN

## 2018-02-25 MED ORDER — HYDROMORPHONE HCL 1 MG/ML IJ SOLN
1.0000 mg | Freq: Once | INTRAMUSCULAR | Status: AC | PRN
  Administered 2018-02-25: 0.5 mg via INTRAVENOUS

## 2018-02-25 MED ORDER — HYDRALAZINE HCL 20 MG/ML IJ SOLN: 5.0000 mg | INTRAMUSCULAR | Status: DC | PRN

## 2018-02-25 MED ORDER — SODIUM CHLORIDE 0.9 % IV SOLN: 250.0000 mL | INTRAVENOUS | Status: DC | PRN

## 2018-02-25 MED ORDER — CEFAZOLIN SODIUM-DEXTROSE 2-4 GM/100ML-% IV SOLN
INTRAVENOUS | Status: AC
  Administered 2018-02-25: 2 g via INTRAVENOUS
  Filled 2018-02-25: qty 100

## 2018-02-25 MED ORDER — SODIUM CHLORIDE 0.9 % IV SOLN
INTRAVENOUS | Status: DC
  Administered 2018-02-25: 1000 mL via INTRAVENOUS

## 2018-02-25 MED ORDER — CLOPIDOGREL BISULFATE 300 MG PO TABS
300.0000 mg | ORAL_TABLET | ORAL | Status: AC
  Administered 2018-02-25: 300 mg via ORAL

## 2018-02-25 MED ORDER — CEFAZOLIN SODIUM-DEXTROSE 2-4 GM/100ML-% IV SOLN
2.0000 g | Freq: Once | INTRAVENOUS | Status: AC
  Administered 2018-02-25: 2 g via INTRAVENOUS

## 2018-02-25 MED ORDER — ACETAMINOPHEN 325 MG PO TABS: 650.0000 mg | ORAL_TABLET | ORAL | Status: DC | PRN

## 2018-02-25 SURGICAL SUPPLY — 39 items
BALLN LUTONIX 5X220X130 (BALLOONS) ×3
BALLN LUTONIX 6X220X130 (BALLOONS) ×3
BALLN LUTONIX 7X80X130 (BALLOONS) ×3
BALLN LUTONIX DCB 4X60X130 (BALLOONS) ×3
BALLN ULTRASCORE 014 4X200X150 (BALLOONS) ×3
BALLN ULTRVRSE 2.5X300X150 (BALLOONS) ×3
BALLN ULTRVRSE 6X250X130 (BALLOONS) ×3
BALLN UTLRAVERSE 2X60X150 (BALLOONS) ×3
BALLOON LUTONIX 5X220X130 (BALLOONS) ×1 IMPLANT
BALLOON LUTONIX 6X220X130 (BALLOONS) ×1 IMPLANT
BALLOON LUTONIX 7X80X130 (BALLOONS) ×1 IMPLANT
BALLOON LUTONIX DCB 4X60X130 (BALLOONS) ×1 IMPLANT
BALLOON ULTRSCRE 014 4X200X150 (BALLOONS) ×1 IMPLANT
BALLOON ULTRVRSE 2.5X300X150 (BALLOONS) ×1 IMPLANT
BALLOON ULTRVRSE 6X250X130 (BALLOONS) ×1 IMPLANT
BALLOON UTLRAVERSE 2X60X150 (BALLOONS) ×1 IMPLANT
CATH CROSSER 14S OTW 146CM (CATHETERS) ×3 IMPLANT
CATH CXI SUPP ST 4FR 135CM (CATHETERS) ×3 IMPLANT
CATH PIG 70CM (CATHETERS) ×3 IMPLANT
CATH SIDEKICK ANG 110 GEOALIGN (CATHETERS) ×3 IMPLANT
CATH VERT 5FR 125CM (CATHETERS) ×3 IMPLANT
DEVICE PRESTO INFLATION (MISCELLANEOUS) ×3 IMPLANT
DEVICE STARCLOSE SE CLOSURE (Vascular Products) ×3 IMPLANT
GLIDEWIRE ADV .014X300CM (WIRE) ×3 IMPLANT
GLIDEWIRE ADV .035X260CM (WIRE) ×3 IMPLANT
GUIDEWIRE LT ZIPWIRE 035X260 (WIRE) ×3 IMPLANT
KIT FLOWMATE PROCEDURAL (KITS) ×3 IMPLANT
LIFESTENT SOLO 7X200X135 (Permanent Stent) ×3 IMPLANT
NEEDLE ENTRY 21GA 7CM ECHOTIP (NEEDLE) ×3 IMPLANT
PACK ANGIOGRAPHY (CUSTOM PROCEDURE TRAY) ×3 IMPLANT
SET INTRO CAPELLA COAXIAL (SET/KITS/TRAYS/PACK) ×3 IMPLANT
SHEATH ANL2 6FRX45 HC (SHEATH) ×3 IMPLANT
SHEATH BRITE TIP 5FRX11 (SHEATH) ×3 IMPLANT
SHIELD RADPAD SCOOP 12X17 (MISCELLANEOUS) ×3 IMPLANT
STENT LIFESTENT 5F 7X80X135 (Permanent Stent) ×3 IMPLANT
TUBING CONTRAST HIGH PRESS 72 (TUBING) ×3 IMPLANT
WIRE AQUATRACK .035X260CM (WIRE) ×3 IMPLANT
WIRE J 3MM .035X145CM (WIRE) ×3 IMPLANT
WIRE SPARTACORE .014X300CM (WIRE) ×3 IMPLANT

## 2018-02-25 NOTE — Op Note (Signed)
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 Introduction catheter into left lower extremity 3rd order catheter placement; Contrast injection left lower extremity for distal runoff. 2. Crosser atherectomy left SFA and popliteal              3. Percutaneous transluminal angioplasty and stent placement superficial femoral and popliteal arteries to 6 to 7 mm with life stents and Lutonix drug-eluting balloons 4. Percutaneous transluminal angioplasty the left posterior tibial artery to 2.5 mm with an Ultraverse balloon  5. Star close closure right common femoral arteriotomy  Anesthesia: Conscious sedation was administered under my direct supervision by the interventional radiology RN. IV Versed plus fentanyl were utilized. Continuous ECG, pulse oximetry and blood pressure was monitored throughout the entire procedure.  Conscious sedation was for a total of 1 hour 55 minutes.  Sheath: 6 [place] Ansell right common femoral  Contrast: 90 cc  Fluoroscopy Time: 17.3 minutes  Indications: Chryl Heck Kuechle presents with increasing pain of the left lower extremity.  Noninvasive studies as well as physical examination show significant deterioration compared to the previous exam.  This suggests the patient is expressing lifestyle limitation secondary to the severity of her leg pain and appears to be progressing toward limb threatening ischemia. The risks and benefits are reviewed all questions answered patient agrees to proceed.  Procedure:Sharanda H Rispoli is a 67 y.o. y.o. female who was identified and  appropriate procedural time out was performed. The patient was then placed supine on the table and prepped and draped in the usual sterile fashion.   Ultrasound was placed in the sterile sleeve and the right groin was evaluated the right common femoral artery was echolucent and pulsatile indicating patency. Image was recorded for the permanent record and under real-time visualization a microneedle was inserted into the common femoral artery followed by the microwire and then the micro-sheath. A J-wire was then advanced through the micro-sheath and a 5 [place] sheath was then inserted over a J-wire. J-wire was then advanced and a 5 French pigtail catheter was positioned at the level of T12.  AP projection of the aorta was then obtained. Pigtail catheter was repositioned to above the bifurcation and a RAO view of the pelvis was obtained. Subsequently a pigtail catheter with the stiff angle Glidewire was used to cross the aortic bifurcation the catheter wire were advanced down into the left distal external iliac artery. Oblique view of the femoral bifurcation was then obtained and subsequently the wire was reintroduced and the pigtail catheter negotiated into the SFA representing third order catheter placement. Distal runoff was then performed.  5000 units of heparin was then given and allowed to circulate and a 6 [place] Ansell sheath was advanced up and over the bifurcation and positioned in the left common femoral artery.  The crosser 14 S catheter and an angled micro-catheter were then prepped on the table.  Glidewire and Kumpe catheter were negotiated into the cul-de-sac of the SFA.  Microcatheter was exchanged for the Kumpe catheter and the wire was removed and the crosser catheter advanced.  Using the combination of the 14 is crosser catheter and the microcatheter the occlusion was crossed and this was verified by injection of contrast through the crosser side-port.  The crosser catheter was then  removed.  035 advantage wire was then reintroduced under fluoroscopic guidance through the microcatheter and the microcatheter was removed.  Initially a 4 mm ultra score balloon was utilized to angioplasty the SFA and then a 5 mm Lutonix balloon was inflated across the occluded area inflation was to 12 atm for 2 minutes.  Follow-up imaging demonstrated there is now flow but there are multiple areas along the length of the artery that was previously occluded that show greater than 60% residual stenosis.  Before I would stent this I elected to move down the leg and treat the tibial disease.  Kumpe catheter was then advanced over the wire and positioned in the distal popliteal, hand injection contrast demonstrated the tibial anatomy in detail.  This showed the anterior tibial was patent from its origin down to the foot the peroneal was poorly visualized in the posterior tibial demonstrated multiple greater than 70% stenoses with several areas that had short segment occlusions.  This multiple sites of disease within the posterior tibial was present all the way down to the ankle.  Wire and catheter were then negotiated down to the ankle.  A 2 mm x 8 cm Ultraverse balloon was used to angioplasty the distal posterior tibial.  Then a 2.5 mm x 30 cm Ultraverse balloon was used to treat the remaining length of the posterior tibial. Each inflation was for 2 minutes at 12 atm. Follow-up imaging demonstrated excellent patency with preservation of the distal runoff and less than 50% residual stenosis throughout the length of the posterior tibial.  Attention was then turned to the tibioperoneal trunk where a 4 mm Lutonix drug-eluting balloon was used to angioplasty the this segment.  Inflation was to 10 atm for 2 minutes.  Follow-up imaging demonstrated less than 20% residual stenosis.  Attention was now returned to the SFA.  The detector was then repositioned and the SFA and popliteal was reimaged greater than 70% stenosis  is noted in multiple locations.  Stenosis is noted in this area and after appropriate measurements are made a 6 x 220 Lutonix balloon was used to angioplasty the superficial femoral and popliteal arteries. Inflations were to 12 atmospheres for 2 minutes. Follow-up imaging demonstrated patency with persistence of these high-grade lesions.  A 7 mm x 200 mm life stent was then deployed from Hunter's canal proximally this was followed by a 7 mm x 80 mm life stent.  Approximately 10 mm of overlap was performed.  The entire length of these stents was then postdilated with a 6 mm balloon to 12 atm for 30 seconds.  One area in the midportion demonstrated approximately 50% residual stenosis and a 7 mm x 80 Lutonix balloon was advanced across this area and angioplastied to 12 atm for approximately 1 minute.  This decreased at residual stenosis to approximately 20%.  Distal runoff was then reassessed and noted to be widely patent.   After review of these images the sheath is pulled into the right external iliac oblique of the common femoral is obtained and a Star close device deployed. There no immediate Complications.  Findings: The abdominal aorta is opacified with a bolus injection contrast. Renal arteries are single and patent. The aorta itself has diffuse disease but no hemodynamically significant lesions. The common and external iliac arteries are widely patent bilaterally.  The left common femoral is widely patent as is the profunda femoris.  The SFA does indeed have a significant stenosis near its origin and then tapers to an occlusion.  There  is reconstitution of the popliteal at the level of the femoral condyles.  Anterior tibial is patent and appears patent all the way to the ankle posterior tibial demonstrates multiple lesions greater than 70% with multiple occluded segments.  Peroneal is poorly visualized.  Tibioperoneal trunk demonstrates an 80% stenosis.     Following angioplasty the posterior tibial  now is in-line flow and looks quite nice.  Tibioperoneal trunk also demonstrates a good result after angioplasty with less than 20% residual stenosis.  Angioplasty of the SFA at Hunter's canal shows greater than 50% residual stenosis along the way in spite of several balloon inflations and therefore life stents are deployed and postdilated primarily to 6 mm with a shorter segment dilated to 7 mm and this yields an excellent result with less than 20% residual stenosis.  Summary: Successful recanalization left lower extremity for limb salvage   Disposition: Patient was taken to the recovery room in stable condition having tolerated the procedure well.  Belenda Cruise Keziah Avis 02/25/2018,10:45 AM

## 2018-02-25 NOTE — Telephone Encounter (Signed)
LVM pt needs a 2 week fu with ABI see gs. from [facility] follow up.

## 2018-02-25 NOTE — H&P (Signed)
[facility] History & Physical Update  The patient was interviewed and re-examined.  The patient's previous History and Physical has been reviewed and is unchanged.  There is no change in the plan of care. We plan to proceed with the scheduled procedure.  Hortencia Pilar, MD  02/25/2018, 8:28 AM

## 2018-03-27 ENCOUNTER — Other Ambulatory Visit (INDEPENDENT_AMBULATORY_CARE_PROVIDER_SITE_OTHER): Payer: Self-pay | Admitting: Vascular Surgery

## 2018-03-27 DIAGNOSIS — Z95820 Peripheral vascular angioplasty status with implants and grafts: Secondary | ICD-10-CM

## 2018-03-27 DIAGNOSIS — I70212 Atherosclerosis of native arteries of extremities with intermittent claudication, left leg: Secondary | ICD-10-CM

## 2018-03-30 ENCOUNTER — Encounter (INDEPENDENT_AMBULATORY_CARE_PROVIDER_SITE_OTHER): Payer: Self-pay | Admitting: Vascular Surgery

## 2018-03-30 ENCOUNTER — Ambulatory Visit (INDEPENDENT_AMBULATORY_CARE_PROVIDER_SITE_OTHER): Payer: Medicare HMO

## 2018-03-30 ENCOUNTER — Ambulatory Visit (INDEPENDENT_AMBULATORY_CARE_PROVIDER_SITE_OTHER): Payer: Medicare HMO | Admitting: Vascular Surgery

## 2018-03-30 VITALS — BP 146/93 | HR 79 | Resp 15 | Ht 64.0 in | Wt 159.0 lb

## 2018-03-30 DIAGNOSIS — E782 Mixed hyperlipidemia: Secondary | ICD-10-CM | POA: Diagnosis not present

## 2018-03-30 DIAGNOSIS — I70212 Atherosclerosis of native arteries of extremities with intermittent claudication, left leg: Secondary | ICD-10-CM | POA: Diagnosis not present

## 2018-03-30 DIAGNOSIS — Z95820 Peripheral vascular angioplasty status with implants and grafts: Secondary | ICD-10-CM | POA: Diagnosis not present

## 2018-03-30 DIAGNOSIS — I10 Essential (primary) hypertension: Secondary | ICD-10-CM | POA: Diagnosis not present

## 2018-03-30 DIAGNOSIS — I70213 Atherosclerosis of native arteries of extremities with intermittent claudication, bilateral legs: Secondary | ICD-10-CM

## 2018-03-30 NOTE — Progress Notes (Signed)
MRN : [id number]  Sherri Dawson is a 67 y.o. ([birth year]) female who presents with chief complaint of  Chief Complaint  Patient presents with  . Follow-up    2 week ABI follow up  .  History of Present Illness:  The patient returns to the office for followup and review status post angiogram with intervention.   Procedure performed 02/25/2018:            1. Introduction catheter into left lower extremity 3rd order catheter placement; Contrast injection left lower extremity for distal runoff. 2. Crosser atherectomy left SFA and popliteal              3. Percutaneous transluminal angioplasty and stent placement superficial femoral and popliteal arteries to 6 to 7 mm with life stents and Lutonix drug-eluting balloons 4. Percutaneous transluminal angioplasty the left posterior tibial artery to 2.5 mm with an Ultraverse balloon  5. Star close closure right common femoral arteriotomy  The patient notes improvement in the lower extremity symptoms. No interval shortening of the patient's claudication distance or rest pain symptoms. Previous wounds have now healed.  No new ulcers or wounds have occurred since the last visit.  There have been no significant changes to the patient's overall health care.  The patient denies amaurosis fugax or recent TIA symptoms. There are no recent neurological changes noted. The patient denies history of DVT, PE or superficial thrombophlebitis. The patient denies recent episodes of angina or shortness of breath.   ABI's Rt=1.12 and Lt=1.15  (previous ABI's Rt=1.15 and Lt=1.24)   Current Meds  Medication Sig  . aspirin EC 81 MG tablet Take 81 mg by mouth daily.   Marland Kitchen aspirin-acetaminophen-caffeine (EXCEDRIN MIGRAINE) 250-250-65 MG tablet Take 1 tablet by mouth every 6 (six) hours as needed for headache.   . clopidogrel (PLAVIX) 75 MG tablet Take 1 tablet (75 mg total) by mouth daily.  . Cyanocobalamin  (VITAMIN B-12) 5000 MCG TBDP Take 5,000 mcg by mouth daily.  . metFORMIN (GLUCOPHAGE) 500 MG tablet Take 500 mg by mouth daily with breakfast.  . olmesartan-hydrochlorothiazide (BENICAR HCT) 20-12.5 MG tablet Take 1 tablet by mouth daily.   Marland Kitchen oxyCODONE-acetaminophen (PERCOCET/ROXICET) 5-325 MG tablet Take 1-2 tablets by mouth every 4 (four) hours as needed for severe pain.  . simvastatin (ZOCOR) 40 MG tablet Take 40 mg by mouth daily.     Past Medical History:  Diagnosis Date  . Hyperlipidemia   . Hypertension     Past Surgical History:  Procedure Laterality Date  . LOWER EXTREMITY ANGIOGRAPHY Left 02/25/2018   Procedure: LOWER EXTREMITY ANGIOGRAPHY;  Surgeon: Katha Cabal, MD;  Location: [facility];  Service: Cardiovascular;  Laterality: Left;  . PERIPHERAL VASCULAR CATHETERIZATION Left 06/19/2016   Procedure: Lower Extremity Angiography;  Surgeon: Katha Cabal, MD;  Location: [facility];  Service: Cardiovascular;  Laterality: Left;    Social History Social History   Tobacco Use  . Smoking status: Former Smoker    Packs/day: 0.50    Years: 45.00    Pack years: 22.50    Types: Cigarettes    Last attempt to quit: 06/18/2016    Years since quitting: 1.7  . Smokeless tobacco: Never Used  Substance Use Topics  . Alcohol use: Yes  . Drug use: No    Family History Family History  Problem Relation Age of Onset  . Heart attack Father   . Breast cancer Maternal Aunt     No Known  Allergies   REVIEW OF SYSTEMS (Negative unless checked)  Constitutional: [] Weight loss  [] Fever  [] Chills Cardiac: [] Chest pain   [] Chest pressure   [] Palpitations   [] Shortness of breath when laying flat   [] Shortness of breath with exertion. Vascular:  [x] Pain in legs with walking   [] Pain in legs at rest  [] History of DVT   [] Phlebitis   [] Swelling in legs   [] Varicose veins   [] Non-healing ulcers Pulmonary:   [] Uses home oxygen   [] Productive cough   [] Hemoptysis    [] Wheeze  [] COPD   [] Asthma Neurologic:  [] Dizziness   [] Seizures   [] History of stroke   [] History of TIA  [] Aphasia   [] Vissual changes   [] Weakness or numbness in arm   [] Weakness or numbness in leg Musculoskeletal:   [] Joint swelling   [] Joint pain   [] Low back pain Hematologic:  [] Easy bruising  [] Easy bleeding   [] Hypercoagulable state   [] Anemic Gastrointestinal:  [] Diarrhea   [] Vomiting  [] Gastroesophageal reflux/heartburn   [] Difficulty swallowing. Genitourinary:  [] Chronic kidney disease   [] Difficult urination  [] Frequent urination   [] Blood in urine Skin:  [] Rashes   [] Ulcers  Psychological:  [] History of anxiety   []  History of major depression.  Physical Examination  Vitals:   03/30/18 0932  BP: (!) 146/93  Pulse: 79  Resp: 15  Weight: 159 lb (72.1 kg)  Height: 5\' 4"  (1.626 m)   Body mass index is 27.29 kg/m. Gen: WD/WN, NAD Head: [place]/AT, No temporalis wasting.  Ear/Nose/Throat: Hearing grossly intact, nares w/o erythema or drainage Eyes: PER, EOMI, sclera nonicteric.  Neck: Supple, no large masses.   Pulmonary:  Good air movement, no audible wheezing bilaterally, no use of accessory muscles.  Cardiac: RRR, no JVD Vascular:  Vessel Right Left  Radial Palpable Palpable  PT Palpable Palpable  DP Palpable Palpable  Gastrointestinal: Non-distended. No guarding/no peritoneal signs.  Musculoskeletal: M/S 5/5 throughout.  No deformity or atrophy.  Neurologic: CN 2-12 intact. Symmetrical.  Speech is fluent. Motor exam as listed above. Psychiatric: Judgment intact, Mood & affect appropriate for pt's clinical situation. Dermatologic: No rashes or ulcers noted.  No changes consistent with cellulitis. Lymph : No lichenification or skin changes of chronic lymphedema.  CBC No results found for: WBC, HGB, HCT, MCV, PLT  BMET    Component Value Date/Time   BUN 18 02/25/2018 0820   CREATININE 1.05 (H) 02/25/2018 0820   GFRNONAA 54 (L) 02/25/2018 0820   GFRAA >60  02/25/2018 0820   CrCl cannot be calculated (Patient's most recent lab result is older than the maximum 21 days allowed.).  COAG No results found for: INR, PROTIME  Radiology No results found.   Assessment/Plan 1. Atherosclerosis of native artery of both lower extremities with intermittent claudication (HCC) Recommend:  The patient is status post successful angiogram with intervention.  The patient reports that the claudication symptoms and leg pain is essentially gone.   The patient denies lifestyle limiting changes at this point in time.  No further invasive studies, angiography or surgery at this time The patient should continue walking and begin a more formal exercise program.  The patient should continue antiplatelet therapy and aggressive treatment of the lipid abnormalities  The patient should continue wearing graduated compression socks 10-15 mmHg strength to control the mild edema.  Patient should undergo noninvasive studies as ordered. The patient will follow up with me after the studies.   - VAS [place] ABI WITH/WO TBI; Future - VAS [place] LOWER EXTREMITY  ARTERIAL DUPLEX; Future  2. Essential hypertension Continue antihypertensive medications as already ordered, these medications have been reviewed and there are no changes at this time.   3. Mixed hyperlipidemia Continue statin as ordered and reviewed, no changes at this time     Hortencia Pilar, MD  03/30/2018 9:43 AM

## 2018-04-03 ENCOUNTER — Encounter: Payer: Self-pay | Admitting: *Deleted

## 2018-04-06 ENCOUNTER — Ambulatory Visit: Payer: Medicare HMO | Admitting: Anesthesiology

## 2018-04-06 ENCOUNTER — Encounter
Admission: RE | Disposition: A | Discharge status: Home / Self Care | Payer: Self-pay | Source: Ambulatory Visit | Attending: Unknown Physician Specialty

## 2018-04-06 ENCOUNTER — Ambulatory Visit
Admission: RE | Admit: 2018-04-06 | Discharge: 2018-04-06 | Disposition: A | Discharge status: Home / Self Care | Payer: Medicare HMO | Source: Ambulatory Visit | Attending: Unknown Physician Specialty | Admitting: Unknown Physician Specialty

## 2018-04-06 ENCOUNTER — Encounter: Payer: Self-pay | Admitting: *Deleted

## 2018-04-06 DIAGNOSIS — Z8249 Family history of ischemic heart disease and other diseases of the circulatory system: Secondary | ICD-10-CM | POA: Diagnosis not present

## 2018-04-06 DIAGNOSIS — I10 Essential (primary) hypertension: Secondary | ICD-10-CM | POA: Diagnosis not present

## 2018-04-06 DIAGNOSIS — Z79899 Other long term (current) drug therapy: Secondary | ICD-10-CM | POA: Insufficient documentation

## 2018-04-06 DIAGNOSIS — Z7902 Long term (current) use of antithrombotics/antiplatelets: Secondary | ICD-10-CM | POA: Diagnosis not present

## 2018-04-06 DIAGNOSIS — Z1211 Encounter for screening for malignant neoplasm of colon: Secondary | ICD-10-CM | POA: Insufficient documentation

## 2018-04-06 DIAGNOSIS — K621 Rectal polyp: Secondary | ICD-10-CM | POA: Diagnosis not present

## 2018-04-06 DIAGNOSIS — E785 Hyperlipidemia, unspecified: Secondary | ICD-10-CM | POA: Insufficient documentation

## 2018-04-06 DIAGNOSIS — Z7984 Long term (current) use of oral hypoglycemic drugs: Secondary | ICD-10-CM | POA: Insufficient documentation

## 2018-04-06 DIAGNOSIS — E538 Deficiency of other specified B group vitamins: Secondary | ICD-10-CM | POA: Insufficient documentation

## 2018-04-06 DIAGNOSIS — K635 Polyp of colon: Secondary | ICD-10-CM | POA: Diagnosis not present

## 2018-04-06 DIAGNOSIS — Z7982 Long term (current) use of aspirin: Secondary | ICD-10-CM | POA: Diagnosis not present

## 2018-04-06 DIAGNOSIS — Z87891 Personal history of nicotine dependence: Secondary | ICD-10-CM | POA: Insufficient documentation

## 2018-04-06 DIAGNOSIS — D123 Benign neoplasm of transverse colon: Secondary | ICD-10-CM | POA: Diagnosis not present

## 2018-04-06 DIAGNOSIS — E1151 Type 2 diabetes mellitus with diabetic peripheral angiopathy without gangrene: Secondary | ICD-10-CM | POA: Insufficient documentation

## 2018-04-06 HISTORY — DX: Unspecified atherosclerosis: I70.90

## 2018-04-06 HISTORY — DX: Deficiency of other specified B group vitamins: E53.8

## 2018-04-06 HISTORY — DX: Peripheral vascular disease, unspecified: I73.9

## 2018-04-06 HISTORY — DX: Type 2 diabetes mellitus without complications: E11.9

## 2018-04-06 HISTORY — PX: COLONOSCOPY WITH PROPOFOL: SHX5780

## 2018-04-06 LAB — GLUCOSE, CAPILLARY: GLUCOSE-CAPILLARY: 110 mg/dL — AB (ref 70–99)

## 2018-04-06 SURGERY — COLONOSCOPY WITH PROPOFOL
Anesthesia: General

## 2018-04-06 MED ORDER — FENTANYL CITRATE (PF) 100 MCG/2ML IJ SOLN
INTRAMUSCULAR | Status: DC | PRN
  Administered 2018-04-06: 50 ug via INTRAVENOUS

## 2018-04-06 MED ORDER — SODIUM CHLORIDE 0.9 % IV SOLN: INTRAVENOUS | Status: DC

## 2018-04-06 MED ORDER — SODIUM CHLORIDE 0.9 % IV SOLN
INTRAVENOUS | Status: DC
  Administered 2018-04-06: 1000 mL via INTRAVENOUS

## 2018-04-06 MED ORDER — MIDAZOLAM HCL 2 MG/2ML IJ SOLN
INTRAMUSCULAR | Status: AC
  Filled 2018-04-06: qty 2

## 2018-04-06 MED ORDER — PROPOFOL 500 MG/50ML IV EMUL
INTRAVENOUS | Status: AC
  Filled 2018-04-06: qty 50

## 2018-04-06 MED ORDER — FENTANYL CITRATE (PF) 100 MCG/2ML IJ SOLN
INTRAMUSCULAR | Status: AC
Start: 2018-04-06 — End: ?
  Filled 2018-04-06: qty 2

## 2018-04-06 MED ORDER — PROPOFOL 500 MG/50ML IV EMUL
INTRAVENOUS | Status: DC | PRN
  Administered 2018-04-06: 100 ug/kg/min via INTRAVENOUS

## 2018-04-06 MED ORDER — EPHEDRINE SULFATE 50 MG/ML IJ SOLN
INTRAMUSCULAR | Status: AC
  Filled 2018-04-06: qty 1

## 2018-04-06 MED ORDER — PROPOFOL 10 MG/ML IV BOLUS
INTRAVENOUS | Status: DC | PRN
  Administered 2018-04-06: 30 mg via INTRAVENOUS

## 2018-04-06 MED ORDER — MIDAZOLAM HCL 2 MG/2ML IJ SOLN
INTRAMUSCULAR | Status: DC | PRN
  Administered 2018-04-06: 2 mg via INTRAVENOUS

## 2018-04-06 NOTE — Anesthesia Postprocedure Evaluation (Signed)
Anesthesia Post Note  Patient: Sherri Dawson  Procedure(s) Performed: COLONOSCOPY WITH PROPOFOL (N/A )  Patient location during evaluation: Endoscopy Anesthesia Type: General Level of consciousness: awake and alert Pain management: pain level controlled Vital Signs Assessment: post-procedure vital signs reviewed and stable Respiratory status: spontaneous breathing and respiratory function stable Cardiovascular status: stable Anesthetic complications: no     Last Vitals:  Vitals:   04/06/18 1000 04/06/18 1010  BP: 109/63 121/76  Pulse: 79 76  Resp: 17 17  Temp:    SpO2: 100% 98%    Last Pain:  Vitals:   04/06/18 0940  TempSrc: Tympanic  PainSc:                  KEPHART,WILLIAM K

## 2018-04-06 NOTE — Transfer of Care (Signed)
Immediate Anesthesia Transfer of Care Note  Patient: Sherri Dawson  Procedure(s) Performed: COLONOSCOPY WITH PROPOFOL (N/A )  Patient Location: PACU  Anesthesia Type:General  Level of Consciousness: awake and sedated  Airway & Oxygen Therapy: Patient Spontanous Breathing and Patient connected to face mask oxygen  Post-op Assessment: Report given to RN and Post -op Vital signs reviewed and stable  Post vital signs: Reviewed and stable  Last Vitals:  Vitals Value Taken Time  BP    Temp    Pulse    Resp    SpO2      Last Pain:  Vitals:   04/06/18 0835  TempSrc: Tympanic  PainSc: 0-No pain         Complications: No apparent anesthesia complications

## 2018-04-06 NOTE — Anesthesia Preprocedure Evaluation (Signed)
Anesthesia Evaluation  Patient identified by MRN, date of birth, ID band Patient awake    Reviewed: Allergy & Precautions, NPO status , Patient's Chart, lab work & pertinent test results  History of Anesthesia Complications Negative for: history of anesthetic complications  Airway Mallampati: II       Dental   Pulmonary neg sleep apnea, neg COPD, former smoker,           Cardiovascular hypertension, Pt. on medications + Peripheral Vascular Disease  (-) Past MI and (-) CHF (-) dysrhythmias (-) Valvular Problems/Murmurs     Neuro/Psych neg Seizures    GI/Hepatic Neg liver ROS, neg GERD  ,  Endo/Other  diabetes, Type 2, Oral Hypoglycemic Agents  Renal/GU negative Renal ROS     Musculoskeletal   Abdominal   Peds  Hematology   Anesthesia Other Findings   Reproductive/Obstetrics                             Anesthesia Physical Anesthesia Plan  ASA: II  Anesthesia Plan: General   Post-op Pain Management:    Induction:   PONV Risk Score and Plan: 3 and TIVA, Propofol infusion and Midazolam  Airway Management Planned: Nasal Cannula  Additional Equipment:   Intra-op Plan:   Post-operative Plan:   Informed Consent: I have reviewed the patients History and Physical, chart, labs and discussed the procedure including the risks, benefits and alternatives for the proposed anesthesia with the patient or authorized representative who has indicated his/her understanding and acceptance.     Plan Discussed with:   Anesthesia Plan Comments:         Anesthesia Quick Evaluation

## 2018-04-06 NOTE — Anesthesia Procedure Notes (Signed)
Performed by: Cook-Martin, Dayvian Blixt °Pre-anesthesia Checklist: Patient identified, Emergency Drugs available, Suction available, Patient being monitored and Timeout performed °Patient Re-evaluated:Patient Re-evaluated prior to induction °Oxygen Delivery Method: Simple face mask °Preoxygenation: Pre-oxygenation with 100% oxygen °Induction Type: IV induction °Placement Confirmation: positive ETCO2 and CO2 detector ° ° ° ° ° ° °

## 2018-04-06 NOTE — Op Note (Signed)
[facility] Patient Name: Sherri Dawson Procedure Date: 04/06/2018 8:57 AM MRN: [id number] Account #: [id number] Date of Birth: 10-Apr-1951 Admit Type: Outpatient Age: 67 Room: [facility] ENDO ROOM 3 Gender: Female Note Status: Finalized Procedure:            Colonoscopy Indications:          Screening for colorectal malignant neoplasm Providers:            Manya Silvas, MD Referring MD:         Rusty Aus, MD (Referring MD) Medicines:            Propofol per Anesthesia Complications:        No immediate complications. Procedure:            Pre-Anesthesia Assessment:                       - After reviewing the risks and benefits, the patient                        was deemed in satisfactory condition to undergo the                        procedure.                       After obtaining informed consent, the colonoscope was                        passed under direct vision. Throughout the procedure,                        the patient's blood pressure, pulse, and oxygen                        saturations were monitored continuously. The                        Colonoscope was introduced through the anus and                        advanced to the the cecum, identified by appendiceal                        orifice and ileocecal valve. The colonoscopy was                        somewhat difficult due to restricted mobility of the                        colon. Successful completion of the procedure was aided                        by applying abdominal pressure. The patient tolerated                        the procedure well. The quality of the bowel                        preparation was excellent. Findings:      A diminutive polyp was found in the ascending colon. The polyp was  sessile. The polyp was removed with a jumbo cold forceps. Resection and       retrieval were complete.      A small polyp was found in the transverse colon. The  polyp was sessile.       The polyp was removed with a hot snare. Resection and retrieval were       complete. To prevent bleeding after the polypectomy, one hemostatic clip       was successfully placed. There was no bleeding during, or at the end, of       the procedure.      A diminutive polyp was found in the recto-sigmoid colon. The polyp was       sessile. The polyp was removed with a jumbo cold forceps. Resection and       retrieval were complete.      A small polyp was found in the recto-sigmoid colon. The polyp was       sessile. The polyp was removed with a hot snare. Resection and retrieval       were complete. Impression:           - One diminutive polyp in the ascending colon, removed                        with a jumbo cold forceps. Resected and retrieved.                       - One small polyp in the transverse colon, removed with                        a hot snare. Resected and retrieved. Clip was placed.                       - One diminutive polyp at the recto-sigmoid colon,                        removed with a jumbo cold forceps. Resected and                        retrieved.                       - One small polyp at the recto-sigmoid colon, removed                        with a hot snare. Resected and retrieved. Recommendation:       - Await pathology results. Manya Silvas, MD 04/06/2018 9:41:00 AM This report has been signed electronically. Number of Addenda: 0 Note Initiated On: 04/06/2018 8:57 AM Scope Withdrawal Time: 0 hours 23 minutes 17 seconds  Total Procedure Duration: 0 hours 33 minutes 55 seconds       [facility]

## 2018-04-06 NOTE — H&P (Addendum)
Primary Care Physician:  Rusty Aus, MD Primary Gastroenterologist:  Dr. Vira Agar  Pre-Procedure History & Physical: HPI:  Sherri Dawson is a 67 y.o. female is here for an colonoscopy.  Done for colon screening.   Past Medical History:  Diagnosis Date  . Atherosclerosis   . B12 deficiency   . Diabetes mellitus without complication ([place])   . Hyperlipidemia   . Hypertension   . PAD (peripheral artery disease) ([place])     Past Surgical History:  Procedure Laterality Date  . LOWER EXTREMITY ANGIOGRAPHY Left 02/25/2018   Procedure: LOWER EXTREMITY ANGIOGRAPHY;  Surgeon: Katha Cabal, MD;  Location: [facility];  Service: Cardiovascular;  Laterality: Left;  . PERIPHERAL VASCULAR CATHETERIZATION Left 06/19/2016   Procedure: Lower Extremity Angiography;  Surgeon: Katha Cabal, MD;  Location: [facility];  Service: Cardiovascular;  Laterality: Left;    Prior to Admission medications   Medication Sig Start Date End Date Taking? Authorizing Provider  aspirin EC 81 MG tablet Take 81 mg by mouth daily.    Yes [provider]  aspirin-acetaminophen-caffeine (EXCEDRIN MIGRAINE) [phone] MG tablet Take 1 tablet by mouth every 6 (six) hours as needed for headache.    Yes [provider]  clopidogrel (PLAVIX) 75 MG tablet Take 1 tablet (75 mg total) by mouth daily. 02/25/18  Yes [name], [name], MD  Cyanocobalamin (VITAMIN B-12) 5000 MCG TBDP Take 5,000 mcg by mouth daily.   Yes [provider]  metFORMIN (GLUCOPHAGE) 500 MG tablet Take 500 mg by mouth daily with breakfast.   Yes [provider]  olmesartan-hydrochlorothiazide (BENICAR HCT) 20-12.5 MG tablet Take 1 tablet by mouth daily.    Yes [provider]  oxyCODONE-acetaminophen (PERCOCET/ROXICET) 5-325 MG tablet Take 1-2 tablets by mouth every 4 (four) hours as needed for severe pain. 02/10/18  Yes Stegmayer, Janalyn Harder, PA-C  simvastatin (ZOCOR) 40 MG tablet  Take 40 mg by mouth daily.    Yes [provider]    Allergies as of 01/16/2018  . (No Known Allergies)    Family History  Problem Relation Age of Onset  . Heart attack Father   . Breast cancer Maternal Aunt     Social History   Socioeconomic History  . Marital status: Married    Spouse name: Not on file  . Number of children: Not on file  . Years of education: Not on file  . Highest education level: Not on file  Occupational History  . Not on file  Social Needs  . Financial resource strain: Not on file  . Food insecurity:    Worry: Not on file    Inability: Not on file  . Transportation needs:    Medical: Not on file    Non-medical: Not on file  Tobacco Use  . Smoking status: Former Smoker    Packs/day: 0.50    Years: 45.00    Pack years: 22.50    Types: Cigarettes    Last attempt to quit: 06/18/2016    Years since quitting: 1.8  . Smokeless tobacco: Never Used  Substance and Sexual Activity  . Alcohol use: Yes  . Drug use: No  . Sexual activity: Not on file  Lifestyle  . Physical activity:    Days per week: Not on file    Minutes per session: Not on file  . Stress: Not on file  Relationships  . Social connections:    Talks on phone: Not on file  Gets together: Not on file    Attends religious service: Not on file    Active member of club or organization: Not on file    Attends meetings of clubs or organizations: Not on file    Relationship status: Not on file  . Intimate partner violence:    Fear of current or ex partner: Not on file    Emotionally abused: Not on file    Physically abused: Not on file    Forced sexual activity: Not on file  Other Topics Concern  . Not on file  Social History Narrative  . Not on file    Review of Systems: See HPI, otherwise negative ROS  Physical Exam: BP (!) 159/96   Pulse 95   Temp (!) 97 F (36.1 C) (Tympanic)   Resp 18   Ht 5\' 4"  (1.626 m)   Wt 70.3 kg   SpO2 100%   BMI 26.61 kg/m   General:   Alert,  pleasant and cooperative in NAD Head:  Normocephalic and atraumatic. Neck:  Supple; no masses or thyromegaly. Lungs:  Clear throughout to auscultation.    Heart:  Regular rate and rhythm. Abdomen:  Soft, nontender and nondistended. Normal bowel sounds, without guarding, and without rebound.   Neurologic:  Alert and  oriented x4;  grossly normal neurologically.  Impression/Plan: Sherri Dawson is here for an colonoscopy to be performed for screening.  Risks, benefits, limitations, and alternatives regarding  colonoscopy have been reviewed with the patient.  Questions have been answered.  All parties agreeable.   Gaylyn Cheers, MD  04/06/2018, 8:55 AM

## 2018-04-06 NOTE — Anesthesia Post-op Follow-up Note (Signed)
Anesthesia QCDR form completed.        

## 2018-04-07 ENCOUNTER — Encounter: Payer: Self-pay | Admitting: Unknown Physician Specialty

## 2018-04-08 LAB — SURGICAL PATHOLOGY

## 2018-05-07 ENCOUNTER — Encounter (INDEPENDENT_AMBULATORY_CARE_PROVIDER_SITE_OTHER): Payer: Medicare HMO

## 2018-05-07 ENCOUNTER — Ambulatory Visit (INDEPENDENT_AMBULATORY_CARE_PROVIDER_SITE_OTHER): Payer: Medicare HMO | Admitting: Vascular Surgery

## 2018-05-22 ENCOUNTER — Other Ambulatory Visit (INDEPENDENT_AMBULATORY_CARE_PROVIDER_SITE_OTHER): Payer: Self-pay | Admitting: Vascular Surgery

## 2018-07-16 ENCOUNTER — Ambulatory Visit (INDEPENDENT_AMBULATORY_CARE_PROVIDER_SITE_OTHER): Payer: Medicare HMO

## 2018-07-16 ENCOUNTER — Encounter (INDEPENDENT_AMBULATORY_CARE_PROVIDER_SITE_OTHER): Payer: Self-pay | Admitting: Vascular Surgery

## 2018-07-16 ENCOUNTER — Ambulatory Visit (INDEPENDENT_AMBULATORY_CARE_PROVIDER_SITE_OTHER): Payer: Medicare HMO | Admitting: Vascular Surgery

## 2018-07-16 VITALS — BP 143/96 | HR 90 | Resp 16 | Ht 64.0 in | Wt 155.0 lb

## 2018-07-16 DIAGNOSIS — E782 Mixed hyperlipidemia: Secondary | ICD-10-CM | POA: Diagnosis not present

## 2018-07-16 DIAGNOSIS — I70213 Atherosclerosis of native arteries of extremities with intermittent claudication, bilateral legs: Secondary | ICD-10-CM

## 2018-07-16 DIAGNOSIS — I743 Embolism and thrombosis of arteries of the lower extremities: Secondary | ICD-10-CM

## 2018-07-16 DIAGNOSIS — I10 Essential (primary) hypertension: Secondary | ICD-10-CM

## 2018-07-19 ENCOUNTER — Encounter (INDEPENDENT_AMBULATORY_CARE_PROVIDER_SITE_OTHER): Payer: Self-pay | Admitting: Vascular Surgery

## 2018-07-19 NOTE — Progress Notes (Signed)
MRN : [id number]  Sherri Dawson is a 67 y.o. ([birth year]) female who presents with chief complaint of  Chief Complaint  Patient presents with  . Follow-up    3 month ABI f/u and Arterial  .  History of Present Illness:   The patient returns to the office for followup and review status post angiogram with intervention.   Procedure performed 02/25/2018:            1. Introduction catheter intoleftlower extremity 3rd order catheter placement;Contrast injectionleftlower extremity for distal runoff. 2.Crosser atherectomy left SFA and popliteal  3. Percutaneous transluminal angioplastyand stent placementsuperficial femoral and popliteal arteries to 6 to 7 mm with life stents and [employer] drug-eluting balloons 4. Percutaneous transluminal angioplastythe left posterior tibial artery to 2.5 mm with an Ultraverse balloon  5. Star close closure rightcommon femoral arteriotomy  The patient notes improvement in the lower extremity symptoms. No interval shortening of the patient's claudication distance or rest pain symptoms. Previous wounds have now healed.  No new ulcers or wounds have occurred since the last visit.  There have been no significant changes to the patient's overall health care.  The patient denies amaurosis fugax or recent TIA symptoms. There are no recent neurological changes noted. The patient denies history of DVT, PE or superficial thrombophlebitis. The patient denies recent episodes of angina or shortness of breath.   ABI's Rt=1.10 and Lt=1.17  (previous ABI's Rt=1.12 and Lt=1.15) Duplex ultrasound of the left lower extremity shows the SFA stent is widely patent with triphasic flow throughout.  Current Meds  Medication Sig  . aspirin EC 81 MG tablet Take 81 mg by mouth daily.   . clopidogrel (PLAVIX) 75 MG tablet TAKE 1 TABLET BY MOUTH DAILY  . Cyanocobalamin (VITAMIN B-12) 5000 MCG TBDP  Take 5,000 mcg by mouth daily.  . metFORMIN (GLUCOPHAGE) 500 MG tablet Take 500 mg by mouth daily with breakfast.  . olmesartan-hydrochlorothiazide (BENICAR HCT) 20-12.5 MG tablet Take 1 tablet by mouth daily.   Marland Kitchen oxyCODONE-acetaminophen (PERCOCET/ROXICET) 5-325 MG tablet Take 1-2 tablets by mouth every 4 (four) hours as needed for severe pain.  . simvastatin (ZOCOR) 40 MG tablet Take 40 mg by mouth daily.     Past Medical History:  Diagnosis Date  . Atherosclerosis   . B12 deficiency   . Diabetes mellitus without complication ([place])   . Hyperlipidemia   . Hypertension   . PAD (peripheral artery disease) ([place])     Past Surgical History:  Procedure Laterality Date  . COLONOSCOPY WITH PROPOFOL N/A 04/06/2018   Procedure: COLONOSCOPY WITH PROPOFOL;  Surgeon: Manya Silvas, MD;  Location: [facility];  Service: Endoscopy;  Laterality: N/A;  . LOWER EXTREMITY ANGIOGRAPHY Left 02/25/2018   Procedure: LOWER EXTREMITY ANGIOGRAPHY;  Surgeon: Katha Cabal, MD;  Location: [facility];  Service: Cardiovascular;  Laterality: Left;  . PERIPHERAL VASCULAR CATHETERIZATION Left 06/19/2016   Procedure: Lower Extremity Angiography;  Surgeon: Katha Cabal, MD;  Location: [facility];  Service: Cardiovascular;  Laterality: Left;    Social History Social History   Tobacco Use  . Smoking status: Former Smoker    Packs/day: 0.50    Years: 45.00    Pack years: 22.50    Types: Cigarettes    Last attempt to quit: 06/18/2016    Years since quitting: 2.0  . Smokeless tobacco: Never Used  Substance Use Topics  . Alcohol use: Yes  . Drug use: No    Family History  Family History  Problem Relation Age of Onset  . Heart attack Father   . Breast cancer Maternal Aunt     No Known Allergies   REVIEW OF SYSTEMS (Negative unless checked)  Constitutional: [] Weight loss  [] Fever  [] Chills Cardiac: [] Chest pain   [] Chest pressure   [] Palpitations   [] Shortness of breath  when laying flat   [] Shortness of breath with exertion. Vascular:  [x] Pain in legs with walking   [] Pain in legs at rest  [] History of DVT   [] Phlebitis   [] Swelling in legs   [] Varicose veins   [] Non-healing ulcers Pulmonary:   [] Uses home oxygen   [] Productive cough   [] Hemoptysis   [] Wheeze  [] COPD   [] Asthma Neurologic:  [] Dizziness   [] Seizures   [] History of stroke   [] History of TIA  [] Aphasia   [] Vissual changes   [] Weakness or numbness in arm   [] Weakness or numbness in leg Musculoskeletal:   [] Joint swelling   [x] Joint pain   [] Low back pain Hematologic:  [] Easy bruising  [] Easy bleeding   [] Hypercoagulable state   [] Anemic Gastrointestinal:  [] Diarrhea   [] Vomiting  [] Gastroesophageal reflux/heartburn   [] Difficulty swallowing. Genitourinary:  [] Chronic kidney disease   [] Difficult urination  [] Frequent urination   [] Blood in urine Skin:  [] Rashes   [] Ulcers  Psychological:  [] History of anxiety   []  History of major depression.  Physical Examination  Vitals:   07/16/18 1423  BP: (!) 143/96  Pulse: 90  Resp: 16  Weight: 155 lb (70.3 kg)  Height: 5\' 4"  (1.626 m)   Body mass index is 26.61 kg/m. Gen: WD/WN, NAD Head: [place]/AT, No temporalis wasting.  Ear/Nose/Throat: Hearing grossly intact, nares w/o erythema or drainage Eyes: PER, EOMI, sclera nonicteric.  Neck: Supple, no large masses.   Pulmonary:  Good air movement, no audible wheezing bilaterally, no use of accessory muscles.  Cardiac: RRR, no JVD Vascular: no ulcers noted Vessel Right Left  Radial Palpable Palpable  PT Palpable Palpable  DP Palpable Palpable  Gastrointestinal: Non-distended. No guarding/no peritoneal signs.  Musculoskeletal: M/S 5/5 throughout.  No deformity or atrophy.  Neurologic: CN 2-12 intact. Symmetrical.  Speech is fluent. Motor exam as listed above. Psychiatric: Judgment intact, Mood & affect appropriate for pt's clinical situation. Dermatologic: No rashes or ulcers noted.  No changes  consistent with cellulitis. Lymph : No lichenification or skin changes of chronic lymphedema.  CBC No results found for: WBC, HGB, HCT, MCV, PLT  BMET    Component Value Date/Time   BUN 18 02/25/2018 0820   CREATININE 1.05 (H) 02/25/2018 0820   GFRNONAA 54 (L) 02/25/2018 0820   GFRAA >60 02/25/2018 0820   CrCl cannot be calculated (Patient's most recent lab result is older than the maximum 21 days allowed.).  COAG No results found for: INR, PROTIME  Radiology No results found.   Assessment/Plan 1. Atherosclerosis of native artery of both lower extremities with intermittent claudication (HCC)  Recommend:  The patient has evidence of atherosclerosis of the lower extremities with claudication.  The patient does not voice lifestyle limiting changes at this point in time.  Noninvasive studies do not suggest clinically significant change.  No invasive studies, angiography or surgery at this time The patient should continue walking and begin a more formal exercise program.  The patient should continue antiplatelet therapy and aggressive treatment of the lipid abnormalities  No changes in the patient's medications at this time  The patient should continue wearing graduated compression socks 10-15 mmHg strength  to control the mild edema.   - VAS [place] LOWER EXTREMITY ARTERIAL DUPLEX; Future - VAS [place] ABI WITH/WO TBI; Future  2. 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  The patient does not voice lifestyle limiting changes at this point in time.  Noninvasive studies do not suggest clinically significant change.  No invasive studies, angiography or surgery at this time The patient should continue walking and begin a more formal exercise program.  The patient should continue antiplatelet therapy and aggressive treatment of the lipid abnormalities  No changes  in the patient's medications at this time  The patient should continue wearing graduated compression socks 10-15 mmHg strength to control the mild edema.   - VAS [place] LOWER EXTREMITY ARTERIAL DUPLEX; Future - VAS [place] ABI WITH/WO TBI; Future  3. Essential hypertension Continue antihypertensive medications as already ordered, these medications have been reviewed and there are no changes at this time.   4. Mixed hyperlipidemia Continue statin as ordered and reviewed, no changes at this time     Hortencia Pilar, MD  07/19/2018 1:53 PM

## 2018-07-29 DIAGNOSIS — D369 Benign neoplasm, unspecified site: Secondary | ICD-10-CM | POA: Insufficient documentation

## 2018-11-22 IMAGING — US US CAROTID DUPLEX BILAT
1 series · 13 of 24 positions shown · non-contrast
Comparison: None

CLINICAL DATA: 64-year-old female with a history of peripheral
artery disease.

Cardiovascular risk factors include hypertension, hyperlipidemia,
known peripheral vascular disease, tobacco use
EXAM:
BILATERAL CAROTID DUPLEX ULTRASOUND
TECHNIQUE: Gray scale imaging, color Doppler and duplex ultrasound were
performed of bilateral carotid and vertebral arteries in the neck.

[Series 1: us carotid duplex bilat · 0.07mm/px · 13 of 69 slices shown]
[im 1/69]
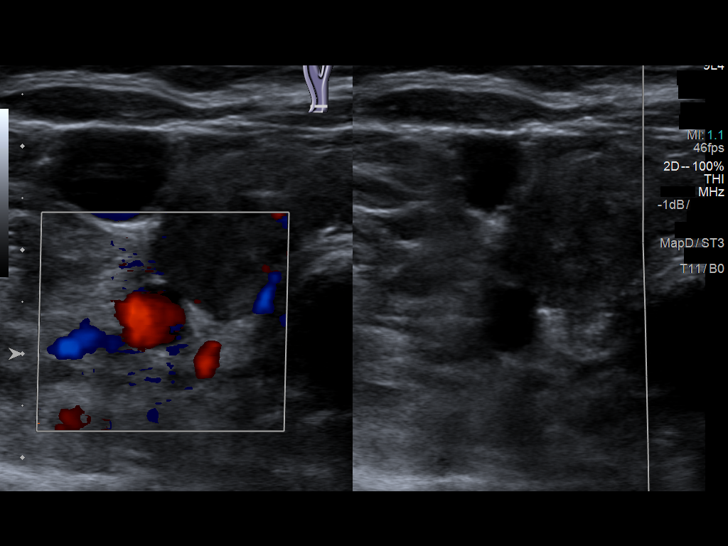
[im 6/69]
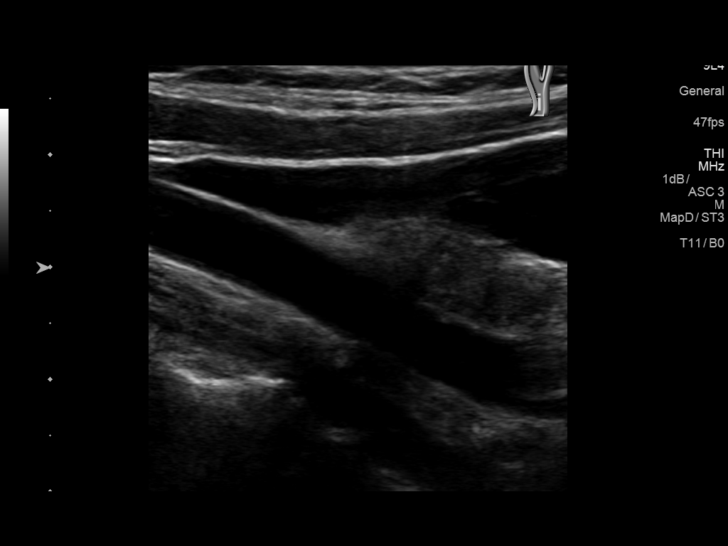
[im 12/69]
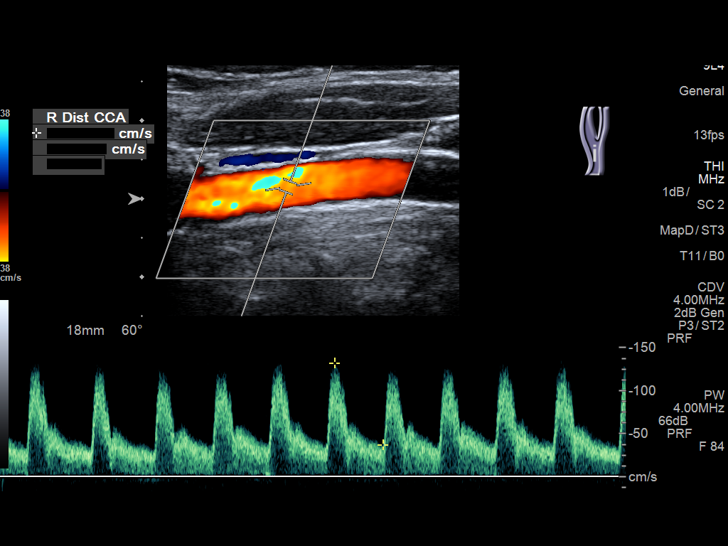
[im 18/69]
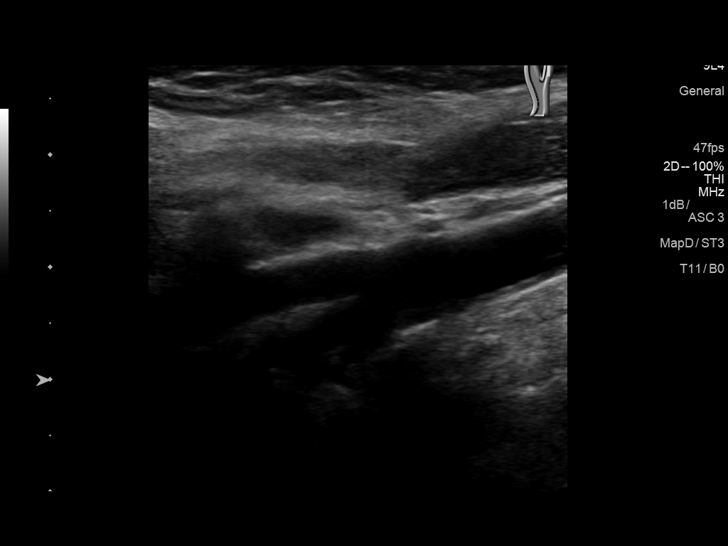
[im 24/69]
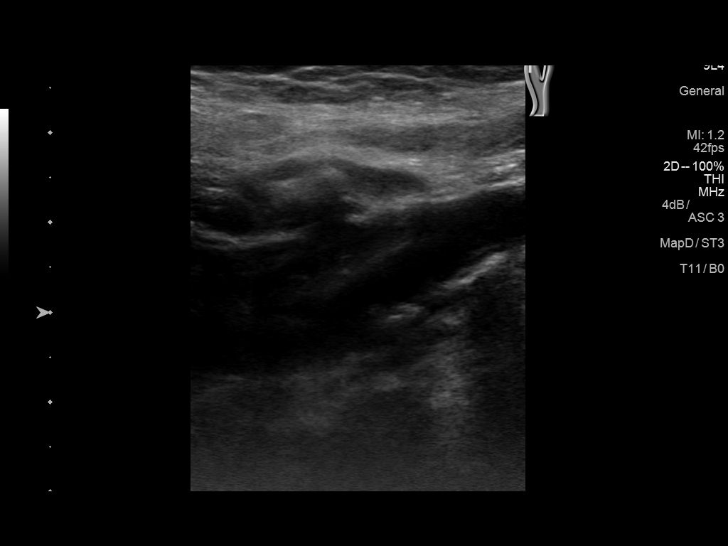
[im 30/69]
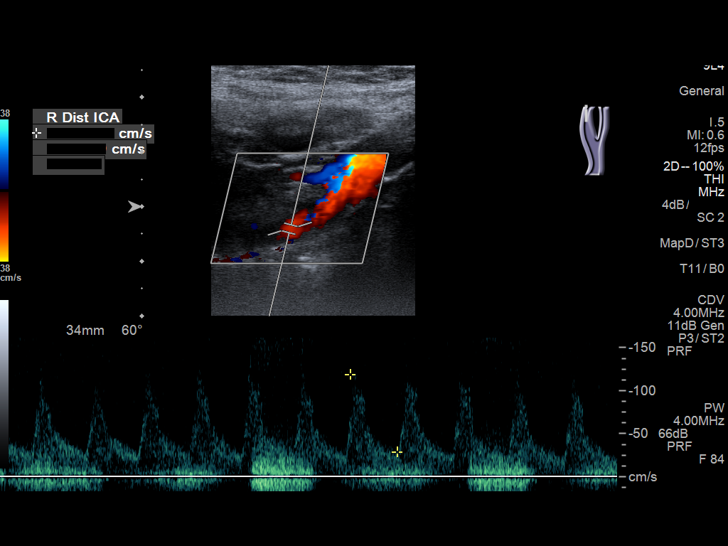
[im 36/69]
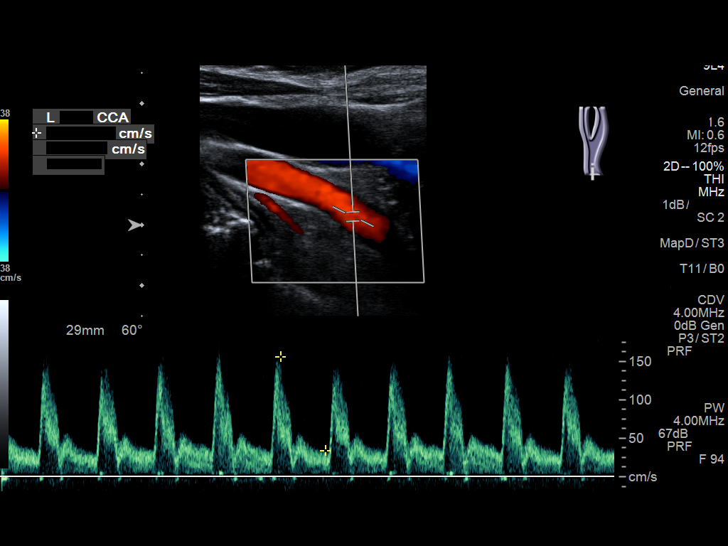
[im 39/69]
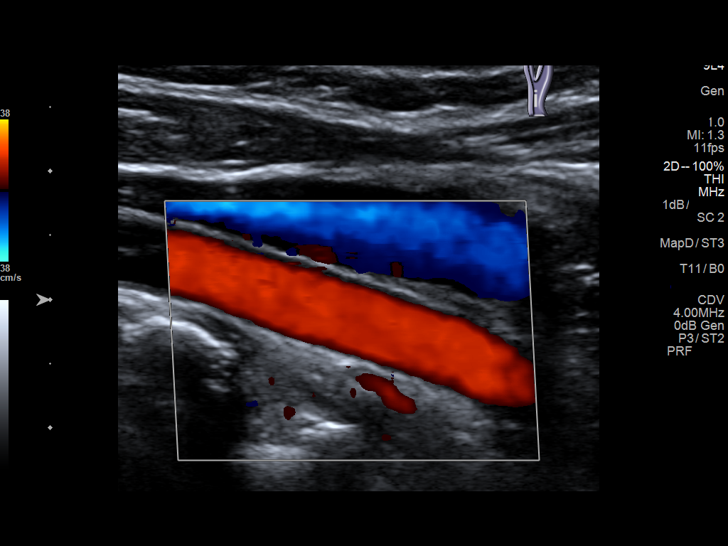
[im 45/69]
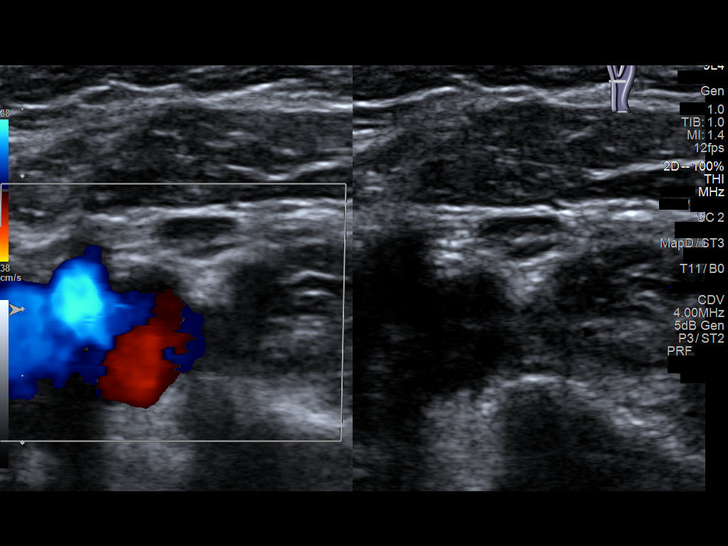
[im 51/69]
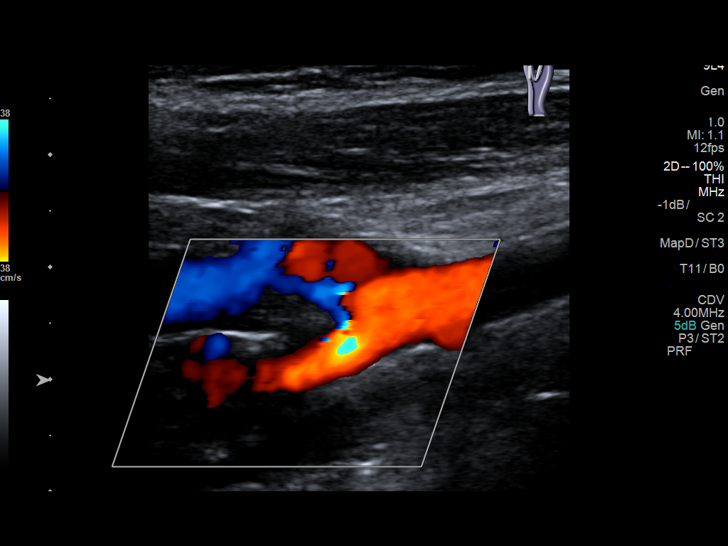
[im 57/69]
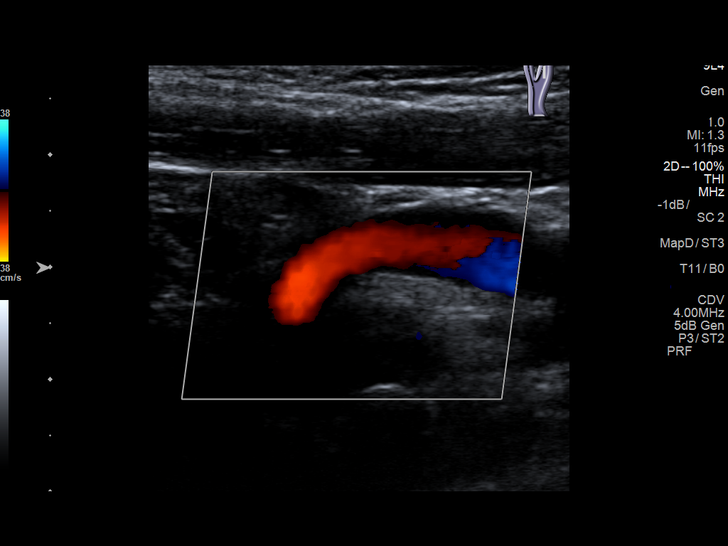
[im 63/69]
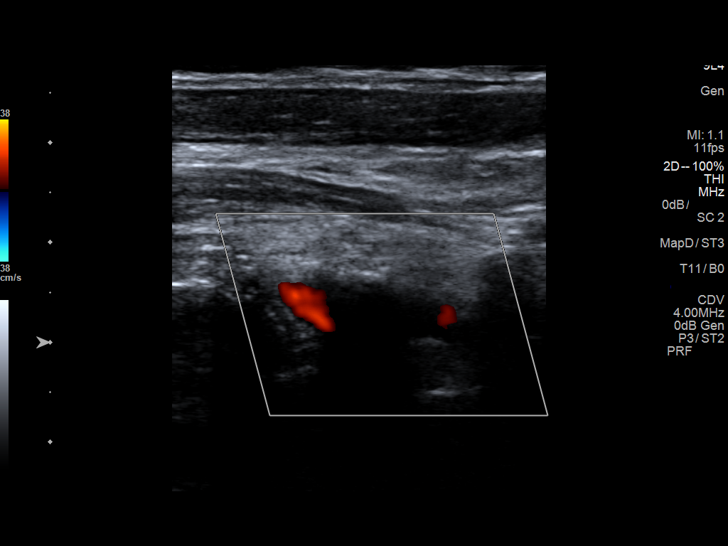
[im 69/69]
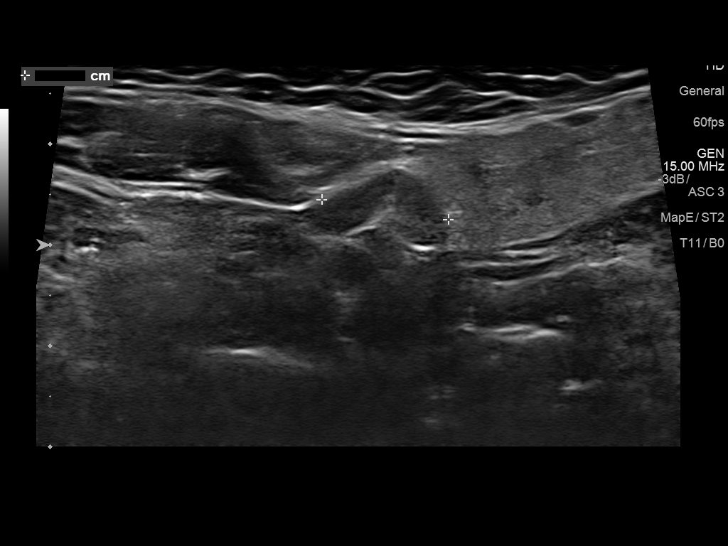

[13 of 24 positions shown; findings below may reference images not displayed]

FINDINGS: Criteria: Quantification of carotid stenosis is based on velocity
parameters that correlate the residual internal carotid diameter
with NASCET-based stenosis levels, using the diameter of the distal
internal carotid lumen as the denominator for stenosis measurement.

The following velocity measurements were obtained:

RIGHT

ICA:  Systolic 155 cm/sec, Diastolic 38 cm/sec

CCA:  141 cm/sec

SYSTOLIC ICA/CCA RATIO:

ECA:  138 cm/sec

LEFT

ICA:  Systolic 105 cm/sec, Diastolic 17 cm/sec

CCA:  157 cm/sec

SYSTOLIC ICA/CCA RATIO:

ECA:  120 cm/sec

Right Brachial SBP: Not acquired

Left Brachial SBP: Not acquired

RIGHT CAROTID ARTERY: No significant calcified disease of the right
common carotid artery. Intermediate waveform maintained.
Heterogeneous plaque without significant calcifications at the right
carotid bifurcation. Low resistance waveform of the right ICA. No
significant tortuosity.

RIGHT VERTEBRAL ARTERY: Antegrade flow with low resistance waveform.

LEFT CAROTID ARTERY: No significant calcified disease of the left
common carotid artery. Intermediate waveform maintained.
Heterogeneous plaque at the left carotid bifurcation without
significant calcifications. Low resistance waveform of the left ICA.

LEFT VERTEBRAL ARTERY:  Antegrade flow with low resistance waveform.

Additional: Lymph node of the right neck, not enlarged by head and
neck criteria.
IMPRESSION: Color duplex indicates minimal heterogeneous plaque, with no
hemodynamically significant stenosis by duplex criteria in the
extracranial cerebrovascular circulation.

## 2019-01-18 ENCOUNTER — Ambulatory Visit (INDEPENDENT_AMBULATORY_CARE_PROVIDER_SITE_OTHER): Payer: Medicare HMO

## 2019-01-18 ENCOUNTER — Encounter (INDEPENDENT_AMBULATORY_CARE_PROVIDER_SITE_OTHER): Payer: Self-pay | Admitting: Vascular Surgery

## 2019-01-18 ENCOUNTER — Ambulatory Visit (INDEPENDENT_AMBULATORY_CARE_PROVIDER_SITE_OTHER): Payer: Medicare HMO | Admitting: Vascular Surgery

## 2019-01-18 ENCOUNTER — Other Ambulatory Visit: Payer: Self-pay

## 2019-01-18 VITALS — BP 127/84 | HR 81 | Resp 12 | Ht 64.0 in | Wt 153.0 lb

## 2019-01-18 DIAGNOSIS — I743 Embolism and thrombosis of arteries of the lower extremities: Secondary | ICD-10-CM

## 2019-01-18 DIAGNOSIS — Z79899 Other long term (current) drug therapy: Secondary | ICD-10-CM

## 2019-01-18 DIAGNOSIS — E782 Mixed hyperlipidemia: Secondary | ICD-10-CM

## 2019-01-18 DIAGNOSIS — I10 Essential (primary) hypertension: Secondary | ICD-10-CM

## 2019-01-18 DIAGNOSIS — I70213 Atherosclerosis of native arteries of extremities with intermittent claudication, bilateral legs: Secondary | ICD-10-CM

## 2019-01-18 DIAGNOSIS — Z87891 Personal history of nicotine dependence: Secondary | ICD-10-CM | POA: Diagnosis not present

## 2019-01-20 ENCOUNTER — Encounter (INDEPENDENT_AMBULATORY_CARE_PROVIDER_SITE_OTHER): Payer: Self-pay | Admitting: Vascular Surgery

## 2019-01-20 NOTE — Progress Notes (Signed)
MRN : [id number]  Sherri Dawson is a 68 y.o. (01/09/1951) female who presents with chief complaint of  Chief Complaint  Patient presents with  . Follow-up  .  History of Present Illness:  The patient returns to the office for followup and review of the noninvasive studies. There have been no interval changes in lower extremity symptoms. No interval shortening of the patient's claudication distance or development of rest pain symptoms. No new ulcers or wounds have occurred since the last visit.  There have been no significant changes to the patient's overall health care.  The patient denies amaurosis fugax or recent TIA symptoms. There are no recent neurological changes noted. The patient denies history of DVT, PE or superficial thrombophlebitis. The patient denies recent episodes of angina or shortness of breath.    Current Meds  Medication Sig  . aspirin EC 81 MG tablet Take 81 mg by mouth daily.   Marland Kitchen aspirin-acetaminophen-caffeine (EXCEDRIN MIGRAINE) 250-250-65 MG tablet Take 1 tablet by mouth every 6 (six) hours as needed for headache.   . clopidogrel (PLAVIX) 75 MG tablet TAKE 1 TABLET BY MOUTH DAILY  . Cyanocobalamin (VITAMIN B-12) 5000 MCG TBDP Take 5,000 mcg by mouth daily.  . metFORMIN (GLUCOPHAGE) 500 MG tablet Take 500 mg by mouth daily with breakfast.  . olmesartan-hydrochlorothiazide (BENICAR HCT) 20-12.5 MG tablet Take 1 tablet by mouth daily.   Marland Kitchen oxyCODONE-acetaminophen (PERCOCET/ROXICET) 5-325 MG tablet Take 1-2 tablets by mouth every 4 (four) hours as needed for severe pain.  . simvastatin (ZOCOR) 40 MG tablet Take 40 mg by mouth daily.     Past Medical History:  Diagnosis Date  . Atherosclerosis   . B12 deficiency   . Diabetes mellitus without complication ([place])   . Hyperlipidemia   . Hypertension   . PAD (peripheral artery disease) ([place])     Past Surgical History:  Procedure Laterality Date  . COLONOSCOPY WITH PROPOFOL N/A 04/06/2018   Procedure:  COLONOSCOPY WITH PROPOFOL;  Surgeon: Manya Silvas, MD;  Location: [facility];  Service: Endoscopy;  Laterality: N/A;  . LOWER EXTREMITY ANGIOGRAPHY Left 02/25/2018   Procedure: LOWER EXTREMITY ANGIOGRAPHY;  Surgeon: Katha Cabal, MD;  Location: [place];  Service: Cardiovascular;  Laterality: Left;  . PERIPHERAL VASCULAR CATHETERIZATION Left 06/19/2016   Procedure: Lower Extremity Angiography;  Surgeon: Katha Cabal, MD;  Location: [facility];  Service: Cardiovascular;  Laterality: Left;    Social History Social History   Tobacco Use  . Smoking status: Former Smoker    Packs/day: 0.50    Years: 45.00    Pack years: 22.50    Types: Cigarettes    Last attempt to quit: 06/18/2016    Years since quitting: 2.5  . Smokeless tobacco: Never Used  Substance Use Topics  . Alcohol use: Yes  . Drug use: No    Family History Family History  Problem Relation Age of Onset  . Heart attack Father   . Breast cancer Maternal Aunt     No Known Allergies   REVIEW OF SYSTEMS (Negative unless checked)  Constitutional: [] Weight loss  [] Fever  [] Chills Cardiac: [] Chest pain   [] Chest pressure   [] Palpitations   [] Shortness of breath when laying flat   [] Shortness of breath with exertion. Vascular:  [x] Pain in legs with walking   [] Pain in legs at rest  [] History of DVT   [] Phlebitis   [] Swelling in legs   [] Varicose veins   [] Non-healing ulcers Pulmonary:   []   Uses home oxygen   [] Productive cough   [] Hemoptysis   [] Wheeze  [] COPD   [] Asthma Neurologic:  [] Dizziness   [] Seizures   [] History of stroke   [] History of TIA  [] Aphasia   [] Vissual changes   [] Weakness or numbness in arm   [] Weakness or numbness in leg Musculoskeletal:   [] Joint swelling   [] Joint pain   [] Low back pain Hematologic:  [] Easy bruising  [] Easy bleeding   [] Hypercoagulable state   [] Anemic Gastrointestinal:  [] Diarrhea   [] Vomiting  [] Gastroesophageal reflux/heartburn   [] Difficulty  swallowing. Genitourinary:  [] Chronic kidney disease   [] Difficult urination  [] Frequent urination   [] Blood in urine Skin:  [] Rashes   [] Ulcers  Psychological:  [] History of anxiety   []  History of major depression.  Physical Examination  Vitals:   01/18/19 1337  BP: 127/84  Pulse: 81  Resp: 12  Weight: 153 lb (69.4 kg)  Height: 5\' 4"  (1.626 m)   Body mass index is 26.26 kg/m. Gen: WD/WN, NAD Head: [place]/AT, No temporalis wasting.  Ear/Nose/Throat: Hearing grossly intact, nares w/o erythema or drainage Eyes: PER, EOMI, sclera nonicteric.  Neck: Supple, no large masses.   Pulmonary:  Good air movement, no audible wheezing bilaterally, no use of accessory muscles.  Cardiac: RRR, no JVD Vascular:  Vessel Right Left  Radial Palpable Palpable  PT Not Palpable Not Palpable  DP Not Palpable Not Palpable  Gastrointestinal: Non-distended. No guarding/no peritoneal signs.  Musculoskeletal: M/S 5/5 throughout.  No deformity or atrophy.  Neurologic: CN 2-12 intact. Symmetrical.  Speech is fluent. Motor exam as listed above. Psychiatric: Judgment intact, Mood & affect appropriate for pt's clinical situation. Dermatologic: No rashes or ulcers noted.  No changes consistent with cellulitis. Lymph : No lichenification or skin changes of chronic lymphedema.  CBC No results found for: WBC, HGB, HCT, MCV, PLT  BMET    Component Value Date/Time   BUN 18 02/25/2018 0820   CREATININE 1.05 (H) 02/25/2018 0820   GFRNONAA 54 (L) 02/25/2018 0820   GFRAA >60 02/25/2018 0820   CrCl cannot be calculated (Patient's most recent lab result is older than the maximum 21 days allowed.).  COAG No results found for: INR, PROTIME  Radiology No results found.    Assessment/Plan 1. Atherosclerosis of native artery of both lower extremities with intermittent claudication (HCC)  Recommend:  The patient has evidence of atherosclerosis of the lower extremities with claudication.  The patient does not  voice lifestyle limiting changes at this point in time.  Noninvasive studies do not suggest clinically significant change.  No invasive studies, angiography or surgery at this time The patient should continue walking and begin a more formal exercise program.  The patient should continue antiplatelet therapy and aggressive treatment of the lipid abnormalities  No changes in the patient's medications at this time  The patient should continue wearing graduated compression socks 10-15 mmHg strength to control the mild edema.   - VAS [place] LOWER EXTREMITY ARTERIAL DUPLEX; Future - VAS [place] ABI WITH/WO TBI; Future  2. Mixed hyperlipidemia Continue statin as ordered and reviewed, no changes at this time   3. Essential hypertension Continue antihypertensive medications as already ordered, these medications have been reviewed and there are no changes at this time.     Hortencia Pilar, MD  01/20/2019 10:06 PM

## 2019-06-09 ENCOUNTER — Other Ambulatory Visit (INDEPENDENT_AMBULATORY_CARE_PROVIDER_SITE_OTHER): Payer: Self-pay | Admitting: Vascular Surgery

## 2019-07-20 DIAGNOSIS — E119 Type 2 diabetes mellitus without complications: Secondary | ICD-10-CM | POA: Insufficient documentation

## 2019-07-20 NOTE — Progress Notes (Signed)
MRN : [id number]  Sherri Dawson is a 68 y.o. (Jun 01, 1951) female who presents with chief complaint of No chief complaint on file. Marland Kitchen  History of Present Illness:  The patient returns to the office for followup and review of the noninvasive studies. There have been no interval changes in lower extremity symptoms. No interval shortening of the patient's claudication distance or development of rest pain symptoms. No new ulcers or wounds have occurred since the last visit.  Angiogram with intervention on 02/25/2018: 1.  Crosser atherectomy left SFA and popliteal  2.  Percutaneous transluminal angioplasty and stent placement superficial femoral and popliteal arteries to 6 to 7 mm with life stents and Lutonix drug-eluting balloons 3.  Percutaneous transluminal angioplasty the left posterior tibial artery to 2.5 mm with an Ultraverse balloon  There have been no significant changes to the patient's overall health care.  The patient denies amaurosis fugax or recent TIA symptoms. There are no recent neurological changes noted. The patient denies history of DVT, PE or superficial thrombophlebitis. The patient denies recent episodes of angina or shortness of breath.   ABI's Rt=1.10 and Lt=1.16 (previous ABI's Rt=1.11 and Lt=1.09) Duplex ultrasound of the left lower extremity shows widely patent arterial system with triphasic signals throughtout  No outpatient medications have been marked as taking for the 07/22/19 encounter (Appointment) with Delana Meyer, [name], MD.    Past Medical History:  Diagnosis Date  . Atherosclerosis   . B12 deficiency   . Diabetes mellitus without complication ([place])   . Hyperlipidemia   . Hypertension   . PAD (peripheral artery disease) ([place])     Past Surgical History:  Procedure Laterality Date  . COLONOSCOPY WITH PROPOFOL N/A 04/06/2018   Procedure: COLONOSCOPY WITH PROPOFOL;  Surgeon: Manya Silvas, MD;  [facility] END[facility]COPY;   Service: Endoscopy;  Laterality: N/A;  . LOWER EXTREMITY ANGIOGRAPHY Left 02/25/2018   Procedure: LOWER EXTREMITY ANGIOGRAPHY;  Surgeon: Katha Cabal, MD;  Location: [place] CV LAB;  Service: Cardiovascular;  Laterality: Left;  . PERIPHERAL VASCULAR CATHETERIZATION Left 06/19/2016   Procedure: Lower Extremity Angiography;  Surgeon: Katha Cabal, MD;  Location: [place] [facility];  Service: Cardiovascular;  Laterality: Left;    Social History Social History   Tobacco Use  . Smoking status: Former Smoker    Packs/day: 0.50    Years: 45.00    Pack years: 22.50    Types: Cigarettes    Quit date: 06/18/2016    Years since quitting: 3.0  . Smokeless tobacco: Never Used  Substance Use Topics  . Alcohol use: Yes  . Drug use: No    Family History Family History  Problem Relation Age of Onset  . Heart attack Father   . Breast cancer Maternal Aunt     No Known Allergies   REVIEW OF SYSTEMS (Negative unless checked)  Constitutional: [] Weight loss  [] Fever  [] Chills Cardiac: [] Chest pain   [] Chest pressure   [] Palpitations   [] Shortness of breath when laying flat   [] Shortness of breath with exertion. Vascular:  [x] Pain in legs with walking   [] Pain in legs at rest  [] History of DVT   [] Phlebitis   [] Swelling in legs   [] Varicose veins   [] Non-healing ulcers Pulmonary:   [] Uses home oxygen   [] Productive cough   [] Hemoptysis   [] Wheeze  [] COPD   [] Asthma Neurologic:  [] Dizziness   [] Seizures   [] History of stroke   [] History of TIA  [] Aphasia   [] Vissual  changes   [] Weakness or numbness in arm   [] Weakness or numbness in leg Musculoskeletal:   [] Joint swelling   [] Joint pain   [] Low back pain Hematologic:  [] Easy bruising  [] Easy bleeding   [] Hypercoagulable state   [] Anemic Gastrointestinal:  [] Diarrhea   [] Vomiting  [] Gastroesophageal reflux/heartburn   [] Difficulty swallowing. Genitourinary:  [] Chronic kidney disease   [] Difficult urination  [] Frequent urination    [] Blood in urine Skin:  [] Rashes   [] Ulcers  Psychological:  [] History of anxiety   []  History of major depression.  Physical Examination  There were no vitals filed for this visit. There is no height or weight on file to calculate BMI. Gen: WD/WN, NAD Head: [place]/AT, No temporalis wasting.  Ear/Nose/Throat: Hearing grossly intact, nares w/o erythema or drainage Eyes: PER, EOMI, sclera nonicteric.  Neck: Supple, no large masses.   Pulmonary:  Good air movement, no audible wheezing bilaterally, no use of accessory muscles.  Cardiac: RRR, no JVD Vascular: Vessel Right Left  Radial Palpable Palpable  PT Palpable Palpable  DP Palpable Palpable  Gastrointestinal: Non-distended. No guarding/no peritoneal signs.  Musculoskeletal: M/S 5/5 throughout.  No deformity or atrophy.  Neurologic: CN 2-12 intact. Symmetrical.  Speech is fluent. Motor exam as listed above. Psychiatric: Judgment intact, Mood & affect appropriate for pt's clinical situation. Dermatologic: No rashes or ulcers noted.  No changes consistent with cellulitis. Lymph : No lichenification or skin changes of chronic lymphedema.  CBC No results found for: WBC, HGB, HCT, MCV, PLT  BMET    Component Value Date/Time   BUN 18 02/25/2018 0820   CREATININE 1.05 (H) 02/25/2018 0820   GFRNONAA 54 (L) 02/25/2018 0820   GFRAA >60 02/25/2018 0820   CrCl cannot be calculated (Patient's most recent lab result is older than the maximum 21 days allowed.).  COAG No results found for: INR, PROTIME  Radiology No results found.   Assessment/Plan 1. Atherosclerosis of native artery of both lower extremities with intermittent claudication (HCC)  Recommend:  The patient has evidence of atherosclerosis of the lower extremities with claudication.  The patient does not voice lifestyle limiting changes at this point in time.  Noninvasive studies do not suggest clinically significant change.  No invasive studies, angiography or surgery  at this time The patient should continue walking and begin a more formal exercise program.  The patient should continue antiplatelet therapy and aggressive treatment of the lipid abnormalities  No changes in the patient's medications at this time  - ABI; Future - LE ARTERIAL; Future  2. Mixed hyperlipidemia Continue statin as ordered and reviewed, no changes at this time   3. Essential hypertension Continue antihypertensive medications as already ordered, these medications have been reviewed and there are no changes at this time.   4. Type 2 diabetes mellitus with other circulatory complication, without long-term current use of insulin (HCC) Continue hypoglycemic medications as already ordered, these medications have been reviewed and there are no changes at this time.  Hgb A1C to be monitored as already arranged by primary service    Hortencia Pilar, MD  07/20/2019 12:28 PM

## 2019-07-22 ENCOUNTER — Ambulatory Visit (INDEPENDENT_AMBULATORY_CARE_PROVIDER_SITE_OTHER): Payer: Medicare HMO | Admitting: Vascular Surgery

## 2019-07-22 ENCOUNTER — Ambulatory Visit (INDEPENDENT_AMBULATORY_CARE_PROVIDER_SITE_OTHER): Payer: Medicare HMO

## 2019-07-22 ENCOUNTER — Other Ambulatory Visit: Payer: Self-pay

## 2019-07-22 ENCOUNTER — Encounter (INDEPENDENT_AMBULATORY_CARE_PROVIDER_SITE_OTHER): Payer: Self-pay | Admitting: Vascular Surgery

## 2019-07-22 VITALS — BP 141/84 | HR 82 | Resp 16 | Ht 64.0 in | Wt 157.0 lb

## 2019-07-22 DIAGNOSIS — I70213 Atherosclerosis of native arteries of extremities with intermittent claudication, bilateral legs: Secondary | ICD-10-CM

## 2019-07-22 DIAGNOSIS — I10 Essential (primary) hypertension: Secondary | ICD-10-CM | POA: Diagnosis not present

## 2019-07-22 DIAGNOSIS — E1159 Type 2 diabetes mellitus with other circulatory complications: Secondary | ICD-10-CM

## 2019-07-22 DIAGNOSIS — E782 Mixed hyperlipidemia: Secondary | ICD-10-CM

## 2020-01-24 ENCOUNTER — Ambulatory Visit (INDEPENDENT_AMBULATORY_CARE_PROVIDER_SITE_OTHER): Payer: Medicare HMO

## 2020-01-24 ENCOUNTER — Other Ambulatory Visit: Payer: Self-pay

## 2020-01-24 ENCOUNTER — Ambulatory Visit (INDEPENDENT_AMBULATORY_CARE_PROVIDER_SITE_OTHER): Payer: Medicare HMO | Admitting: Vascular Surgery

## 2020-01-24 ENCOUNTER — Encounter (INDEPENDENT_AMBULATORY_CARE_PROVIDER_SITE_OTHER): Payer: Self-pay | Admitting: Vascular Surgery

## 2020-01-24 VITALS — BP 145/91 | HR 80 | Ht 64.0 in | Wt 156.0 lb

## 2020-01-24 DIAGNOSIS — E1159 Type 2 diabetes mellitus with other circulatory complications: Secondary | ICD-10-CM | POA: Diagnosis not present

## 2020-01-24 DIAGNOSIS — I10 Essential (primary) hypertension: Secondary | ICD-10-CM

## 2020-01-24 DIAGNOSIS — I70213 Atherosclerosis of native arteries of extremities with intermittent claudication, bilateral legs: Secondary | ICD-10-CM

## 2020-01-24 DIAGNOSIS — E782 Mixed hyperlipidemia: Secondary | ICD-10-CM

## 2020-01-25 ENCOUNTER — Encounter (INDEPENDENT_AMBULATORY_CARE_PROVIDER_SITE_OTHER): Payer: Self-pay | Admitting: Vascular Surgery

## 2020-01-25 NOTE — Progress Notes (Signed)
MRN : [id number]  Sherri Dawson is a 69 y.o. (Dec 21, 1950) female who presents with chief complaint of  Chief Complaint  Patient presents with  . Follow-up    U/S Follow up  .  History of Present Illness:   The patient returns to the office for followup and review of the noninvasive studies. There have been no interval changes in lower extremity symptoms. No interval shortening of the patient's claudication distance or development of rest pain symptoms. No new ulcers or wounds have occurred since the last visit.  Angiogram with intervention on 02/25/2018: 1.  Crosser atherectomy left SFA and popliteal  2.  Percutaneous transluminal angioplastyand stent placementsuperficial femoral and popliteal arteries to 6 to 7 mm with life stents and Lutonix drug-eluting balloons 3.  Percutaneous transluminal angioplastythe left posterior tibial artery to 2.5 mm with an Ultraverse balloon  There have been no significant changes to the patient's overall health care.  The patient denies amaurosis fugax or recent TIA symptoms. There are no recent neurological changes noted. The patient denies history of DVT, PE or superficial thrombophlebitis. The patient denies recent episodes of angina or shortness of breath.  ABI's Rt=1.21 and Lt=1.17 (previous ABI's Rt=1.10 and Lt=1.16) Duplex ultrasound of the left lower extremity shows widely patent arterial system with biphasic signals through out    Current Meds  Medication Sig  . aspirin EC 81 MG tablet Take 81 mg by mouth daily.   Marland Kitchen aspirin-acetaminophen-caffeine (EXCEDRIN MIGRAINE) 250-250-65 MG tablet Take 1 tablet by mouth every 6 (six) hours as needed for headache.   . clopidogrel (PLAVIX) 75 MG tablet TAKE 1 TABLET BY MOUTH DAILY  . Cyanocobalamin (VITAMIN B-12) 5000 MCG TBDP Take 5,000 mcg by mouth daily.  . metFORMIN (GLUCOPHAGE) 500 MG tablet Take 500 mg by mouth daily with breakfast.  . olmesartan-hydrochlorothiazide  (BENICAR HCT) 20-12.5 MG tablet Take 1 tablet by mouth daily.   Marland Kitchen oxyCODONE-acetaminophen (PERCOCET/ROXICET) 5-325 MG tablet Take 1-2 tablets by mouth every 4 (four) hours as needed for severe pain.  . simvastatin (ZOCOR) 40 MG tablet Take 40 mg by mouth daily.     Past Medical History:  Diagnosis Date  . Atherosclerosis   . B12 deficiency   . Diabetes mellitus without complication ([place])   . Hyperlipidemia   . Hypertension   . PAD (peripheral artery disease) ([place])     Past Surgical History:  Procedure Laterality Date  . COLONOSCOPY WITH PROPOFOL N/A 04/06/2018   Procedure: COLONOSCOPY WITH PROPOFOL;  Surgeon: Manya Silvas, MD;  Location: [facility] ENDOSCOPY;  Service: Endoscopy;  Laterality: N/A;  . LOWER EXTREMITY ANGIOGRAPHY Left 02/25/2018   Procedure: LOWER EXTREMITY ANGIOGRAPHY;  Surgeon: Katha Cabal, MD;  Location: [place] [facility] LAB;  Service: Cardiovascular;  Laterality: Left;  . PERIPHERAL VASCULAR CATHETERIZATION Left 06/19/2016   Procedure: Lower Extremity Angiography;  Surgeon: Katha Cabal, MD;  Location: [place] [facility] LAB;  Service: Cardiovascular;  Laterality: Left;    Social History Social History   Tobacco Use  . Smoking status: Former Smoker    Packs/day: 0.50    Years: 45.00    Pack years: 22.50    Types: Cigarettes    Quit date: 06/18/2016    Years since quitting: 3.6  . Smokeless tobacco: Never Used  Vaping Use  . Vaping Use: Never used  Substance Use Topics  . Alcohol use: Yes  . Drug use: No    Family History Family History  Problem Relation Age of Onset  .  Heart attack Father   . Breast cancer Maternal Aunt     No Known Allergies   REVIEW OF SYSTEMS (Negative unless checked)  Constitutional: [] Weight loss  [] Fever  [] Chills Cardiac: [] Chest pain   [] Chest pressure   [] Palpitations   [] Shortness of breath when laying flat   [] Shortness of breath with exertion. Vascular:  [] Pain in legs with walking   [] Pain in legs at rest   [] History of DVT   [] Phlebitis   [] Swelling in legs   [] Varicose veins   [] Non-healing ulcers Pulmonary:   [] Uses home oxygen   [] Productive cough   [] Hemoptysis   [] Wheeze  [] COPD   [] Asthma Neurologic:  [] Dizziness   [] Seizures   [] History of stroke   [] History of TIA  [] Aphasia   [] Vissual changes   [] Weakness or numbness in arm   [] Weakness or numbness in leg Musculoskeletal:   [] Joint swelling   [x] Joint pain   [x] Low back pain Hematologic:  [] Easy bruising  [] Easy bleeding   [] Hypercoagulable state   [] Anemic Gastrointestinal:  [] Diarrhea   [] Vomiting  [] Gastroesophageal reflux/heartburn   [] Difficulty swallowing. Genitourinary:  [] Chronic kidney disease   [] Difficult urination  [] Frequent urination   [] Blood in urine Skin:  [] Rashes   [] Ulcers  Psychological:  [] History of anxiety   []  History of major depression.  Physical Examination  Vitals:   01/24/20 0929  BP: (!) 145/91  Pulse: 80  Weight: 156 lb (70.8 kg)  Height: 5\' 4"  (1.626 m)   Body mass index is 26.78 kg/m. Gen: WD/WN, NAD Head: [place]/AT, No temporalis wasting.  Ear/Nose/Throat: Hearing grossly intact, nares w/o erythema or drainage Eyes: PER, EOMI, sclera nonicteric.  Neck: Supple, no large masses.   Pulmonary:  Good air movement, no audible wheezing bilaterally, no use of accessory muscles.  Cardiac: RRR, no JVD Vascular:  Vessel Right Left  Radial Palpable Palpable  PT Palpable Palpable  DP Palpable Palpable  Gastrointestinal: Non-distended. No guarding/no peritoneal signs.  Musculoskeletal: M/S 5/5 throughout.  No deformity or atrophy.  Neurologic: CN 2-12 intact. Symmetrical.  Speech is fluent. Motor exam as listed above. Psychiatric: Judgment intact, Mood & affect appropriate for pt's clinical situation. Dermatologic: No rashes or ulcers noted.  No changes consistent with cellulitis.   CBC No results found for: WBC, HGB, HCT, MCV, PLT  BMET    Component Value Date/Time   BUN 18 02/25/2018 0820    CREATININE 1.05 (H) 02/25/2018 0820   GFRNONAA 54 (L) 02/25/2018 0820   GFRAA >60 02/25/2018 0820   CrCl cannot be calculated (Patient's most recent lab result is older than the maximum 21 days allowed.).  COAG No results found for: INR, PROTIME  Radiology ABI  Result Date: 01/24/2020 LOWER EXTREMITY DOPPLER STUDY Indications: Peripheral artery disease.  Vascular Interventions: Left SFA stent on 06/19/16. Performing Technologist: Blondell Reveal RT, RDMS, RVT  Examination Guidelines: A complete evaluation includes at minimum, Doppler waveform signals and systolic blood pressure reading at the level of bilateral brachial, anterior tibial, and posterior tibial arteries, when vessel segments are accessible. Bilateral testing is considered an integral part of a complete examination. Photoelectric Plethysmograph (PPG) waveforms and toe systolic pressure readings are included as required and additional duplex testing as needed. Limited examinations for reoccurring indications may be performed as noted.  ABI Findings: +---------+------------------+-----+--------+--------+ Right    Rt Pressure (mmHg)IndexWaveformComment  +---------+------------------+-----+--------+--------+ Brachial 128                                     +---------+------------------+-----+--------+--------+  ATA      141               1.08 biphasic         +---------+------------------+-----+--------+--------+ PTA      159               1.21 biphasic         +---------+------------------+-----+--------+--------+ Great Toe137               1.05 Normal           +---------+------------------+-----+--------+--------+ +---------+------------------+-----+--------+-------+ Left     Lt Pressure (mmHg)IndexWaveformComment +---------+------------------+-----+--------+-------+ Brachial 131                                    +---------+------------------+-----+--------+-------+ ATA      137               1.05  biphasic        +---------+------------------+-----+--------+-------+ PTA      153               1.17 biphasic        +---------+------------------+-----+--------+-------+ Great Toe111               0.85 Normal          +---------+------------------+-----+--------+-------+ +-------+-----------+-----------+------------+------------+ ABI/TBIToday's ABIToday's TBIPrevious ABIPrevious TBI +-------+-----------+-----------+------------+------------+ Right  1.21       1.05       1.10        0.62         +-------+-----------+-----------+------------+------------+ Left   1.17       0.85       1.16        1.02         +-------+-----------+-----------+------------+------------+ Bilateral ABIs appear essentially unchanged compared to prior study on 07/22/19.  Summary: Bilateral: Bilateral ankle-brachial indexes are within normal range. No evidence of significant lower extremity arterial disease. Bilateral toe-brachial indexes are within normal range.  *See table(s) above for measurements and observations.  Electronically signed by Hortencia Pilar MD on 01/24/2020 at 5:19:57 PM.   Final    LE ARTERIAL  Result Date: 01/24/2020 LOWER EXTREMITY ARTERIAL DUPLEX STUDY Indications: Peripheral artery disease.  Vascular Interventions: Left SFA stent on 06/19/16. Current ABI:            Right=1.21 & Left=1.17 Performing Technologist: Blondell Reveal RT, RDMS, RVT  Examination Guidelines: A complete evaluation includes B-mode imaging, spectral Doppler, color Doppler, and power Doppler as needed of all accessible portions of each vessel. Bilateral testing is considered an integral part of a complete examination. Limited examinations for reoccurring indications may be performed as noted.  +-----------+--------+-----+--------+--------+--------+ LEFT       PSV cm/sRatioStenosisWaveformComments +-----------+--------+-----+--------+--------+--------+ CFA Mid    111                  biphasic          +-----------+--------+-----+--------+--------+--------+ DFA        64                   biphasic         +-----------+--------+-----+--------+--------+--------+ SFA Prox   100                  biphasic         +-----------+--------+-----+--------+--------+--------+ SFA Mid    60  biphasic         +-----------+--------+-----+--------+--------+--------+ SFA Distal 42                   biphasic         +-----------+--------+-----+--------+--------+--------+ POP Prox   58                   biphasic         +-----------+--------+-----+--------+--------+--------+ POP Distal 48                   biphasic         +-----------+--------+-----+--------+--------+--------+ ATA Distal 25                   biphasic         +-----------+--------+-----+--------+--------+--------+ PTA Distal 47                   biphasic         +-----------+--------+-----+--------+--------+--------+ PERO Distal25                   biphasic         +-----------+--------+-----+--------+--------+--------+  Summary: Left: Normal examination. No evidence of arterial occlusive disease.  See table(s) above for measurements and observations. Electronically signed by Hortencia Pilar MD on 01/24/2020 at 5:20:00 PM.    Final      Assessment/Plan 1. Atherosclerosis of native artery of both lower extremities with intermittent claudication (HCC)  Recommend:  The patient has evidence of atherosclerosis of the lower extremities with claudication.  The patient does not voice lifestyle limiting changes at this point in time.  Noninvasive studies do not suggest clinically significant change.  No invasive studies, angiography or surgery at this time The patient should continue walking and begin a more formal exercise program.  The patient should continue antiplatelet therapy and aggressive treatment of the lipid abnormalities  No changes in the patient's medications at this  time  The patient should continue wearing graduated compression socks 10-15 mmHg strength to control the mild edema.   - VAS [place] ABI WITH/WO TBI; Future - VAS [place] LOWER EXTREMITY ARTERIAL DUPLEX; Future  2. Essential hypertension Continue antihypertensive medications as already ordered, these medications have been reviewed and there are no changes at this time.   3. Type 2 diabetes mellitus with other circulatory complication, without long-term current use of insulin (HCC) Continue hypoglycemic medications as already ordered, these medications have been reviewed and there are no changes at this time.  Hgb A1C to be monitored as already arranged by primary service   4. Mixed hyperlipidemia Continue statin as ordered and reviewed, no changes at this time    Hortencia Pilar, MD  01/25/2020 10:39 AM

## 2020-06-22 ENCOUNTER — Other Ambulatory Visit (INDEPENDENT_AMBULATORY_CARE_PROVIDER_SITE_OTHER): Payer: Self-pay | Admitting: Vascular Surgery

## 2021-01-22 ENCOUNTER — Encounter (INDEPENDENT_AMBULATORY_CARE_PROVIDER_SITE_OTHER): Payer: Medicare HMO

## 2021-01-22 ENCOUNTER — Ambulatory Visit (INDEPENDENT_AMBULATORY_CARE_PROVIDER_SITE_OTHER): Payer: Medicare HMO | Admitting: Vascular Surgery

## 2021-02-14 ENCOUNTER — Other Ambulatory Visit (INDEPENDENT_AMBULATORY_CARE_PROVIDER_SITE_OTHER): Payer: Self-pay | Admitting: Vascular Surgery

## 2021-02-14 DIAGNOSIS — I739 Peripheral vascular disease, unspecified: Secondary | ICD-10-CM

## 2021-02-15 ENCOUNTER — Ambulatory Visit (INDEPENDENT_AMBULATORY_CARE_PROVIDER_SITE_OTHER): Payer: Medicare HMO | Admitting: Vascular Surgery

## 2021-02-15 ENCOUNTER — Ambulatory Visit (INDEPENDENT_AMBULATORY_CARE_PROVIDER_SITE_OTHER): Payer: Medicare HMO

## 2021-02-15 ENCOUNTER — Other Ambulatory Visit: Payer: Self-pay

## 2021-02-15 VITALS — BP 141/93 | HR 111 | Resp 18 | Ht 64.0 in | Wt 146.0 lb

## 2021-02-15 DIAGNOSIS — I739 Peripheral vascular disease, unspecified: Secondary | ICD-10-CM

## 2021-02-15 DIAGNOSIS — E1159 Type 2 diabetes mellitus with other circulatory complications: Secondary | ICD-10-CM | POA: Diagnosis not present

## 2021-02-15 DIAGNOSIS — I70213 Atherosclerosis of native arteries of extremities with intermittent claudication, bilateral legs: Secondary | ICD-10-CM | POA: Diagnosis not present

## 2021-02-15 DIAGNOSIS — E782 Mixed hyperlipidemia: Secondary | ICD-10-CM

## 2021-02-15 DIAGNOSIS — I10 Essential (primary) hypertension: Secondary | ICD-10-CM

## 2021-02-15 NOTE — Progress Notes (Signed)
MRN : [id number]  Sherri Dawson is a 70 y.o. (Oct 13, 1950) female who presents with chief complaint of  Chief Complaint  Patient presents with   Follow-up    ultrasound  .  History of Present Illness:  The patient returns to the office for followup and review of the noninvasive studies. There have been no interval changes in lower extremity symptoms. No interval shortening of the patient's claudication distance or development of rest pain symptoms. No new ulcers or wounds have occurred since the last visit.   Angiogram with intervention on 02/25/2018: 1.  Crosser atherectomy left SFA and popliteal            2.  Percutaneous transluminal angioplasty and stent placement superficial femoral and popliteal arteries to 6 to 7 mm with life stents and Lutonix drug-eluting balloons 3.  Percutaneous transluminal angioplasty the left posterior tibial artery to 2.5 mm with an Ultraverse balloon   There have been no significant changes to the patient's overall health care.   The patient denies amaurosis fugax or recent TIA symptoms. There are no recent neurological changes noted. The patient denies history of DVT, PE or superficial thrombophlebitis. The patient denies recent episodes of angina or shortness of breath.    ABI's Rt=1.27 and Lt=1.20 (previous ABI's Rt=1.21 and Lt=1.17) Duplex ultrasound of the left lower extremity shows widely patent arterial system with biphasic signals through out  Current Meds  Medication Sig   aspirin EC 81 MG tablet Take 81 mg by mouth daily.    aspirin-acetaminophen-caffeine (EXCEDRIN MIGRAINE) 250-250-65 MG tablet Take 1 tablet by mouth every 6 (six) hours as needed for headache.    clopidogrel (PLAVIX) 75 MG tablet TAKE 1 TABLET BY MOUTH DAILY   Cyanocobalamin (VITAMIN B-12) 5000 MCG TBDP Take 5,000 mcg by mouth daily.   metFORMIN (GLUCOPHAGE) 500 MG tablet Take 500 mg by mouth daily with breakfast.   olmesartan-hydrochlorothiazide (BENICAR HCT)  20-12.5 MG tablet Take 1 tablet by mouth daily.    oxyCODONE-acetaminophen (PERCOCET/ROXICET) 5-325 MG tablet Take 1-2 tablets by mouth every 4 (four) hours as needed for severe pain.   simvastatin (ZOCOR) 40 MG tablet Take 40 mg by mouth daily.     Past Medical History:  Diagnosis Date   Atherosclerosis    B12 deficiency    Diabetes mellitus without complication ([place])    Hyperlipidemia    Hypertension    PAD (peripheral artery disease) ([place])     Past Surgical History:  Procedure Laterality Date   COLONOSCOPY WITH PROPOFOL N/A 04/06/2018   Procedure: COLONOSCOPY WITH PROPOFOL;  Surgeon: Manya Silvas, MD;  Location: [facility] ENDOSCOPY;  Service: Endoscopy;  Laterality: N/A;   LOWER EXTREMITY ANGIOGRAPHY Left 02/25/2018   Procedure: LOWER EXTREMITY ANGIOGRAPHY;  Surgeon: Katha Cabal, MD;  Location: [place] [facility];  Service: Cardiovascular;  Laterality: Left;   PERIPHERAL VASCULAR CATHETERIZATION Left 06/19/2016   Procedure: Lower Extremity Angiography;  Surgeon: Katha Cabal, MD;  Location: [place] [facility];  Service: Cardiovascular;  Laterality: Left;    Social History Social History   Tobacco Use   Smoking status: Former    Packs/day: 0.50    Years: 45.00    Pack years: 22.50    Types: Cigarettes    Quit date: 06/18/2016    Years since quitting: 4.6   Smokeless tobacco: Never  Vaping Use   Vaping Use: Never used  Substance Use Topics   Alcohol use: Yes   Drug use: No  Family History Family History  Problem Relation Age of Onset   Heart attack Father    Breast cancer Maternal Aunt     No Known Allergies   REVIEW OF SYSTEMS (Negative unless checked)  Constitutional: [] Weight loss  [] Fever  [] Chills Cardiac: [] Chest pain   [] Chest pressure   [] Palpitations   [] Shortness of breath when laying flat   [] Shortness of breath with exertion. Vascular:  [x] Pain in legs with walking   [] Pain in legs at rest  [] History of DVT   [] Phlebitis    [] Swelling in legs   [] Varicose veins   [] Non-healing ulcers Pulmonary:   [] Uses home oxygen   [] Productive cough   [] Hemoptysis   [] Wheeze  [] COPD   [] Asthma Neurologic:  [] Dizziness   [] Seizures   [] History of stroke   [] History of TIA  [] Aphasia   [] Vissual changes   [] Weakness or numbness in arm   [] Weakness or numbness in leg Musculoskeletal:   [] Joint swelling   [] Joint pain   [] Low back pain Hematologic:  [] Easy bruising  [] Easy bleeding   [] Hypercoagulable state   [] Anemic Gastrointestinal:  [] Diarrhea   [] Vomiting  [] Gastroesophageal reflux/heartburn   [] Difficulty swallowing. Genitourinary:  [] Chronic kidney disease   [] Difficult urination  [] Frequent urination   [] Blood in urine Skin:  [] Rashes   [] Ulcers  Psychological:  [] History of anxiety   []  History of major depression.  Physical Examination  Vitals:   02/15/21 1532  BP: (!) 141/93  Pulse: (!) 111  Resp: 18  Weight: 146 lb (66.2 kg)  Height: 5\' 4"  (1.626 m)   Body mass index is 25.06 kg/m. Gen: WD/WN, NAD Head: [place]/AT, No temporalis wasting.  Ear/Nose/Throat: Hearing grossly intact, nares w/o erythema or drainage Eyes: PER, EOMI, sclera nonicteric.  Neck: Supple, no large masses.   Pulmonary:  Good air movement, no audible wheezing bilaterally, no use of accessory muscles.  Cardiac: RRR, no JVD Vascular:   Vessel Right Left  Radial Palpable Palpable  PT Palpable Palpable  DP Palpable Palpable  Gastrointestinal: Non-distended. No guarding/no peritoneal signs.  Musculoskeletal: M/S 5/5 throughout.  No deformity or atrophy.  Neurologic: CN 2-12 intact. Symmetrical.  Speech is fluent. Motor exam as listed above. Psychiatric: Judgment intact, Mood & affect appropriate for pt's clinical situation. Dermatologic: No rashes or ulcers noted.  No changes consistent with cellulitis. Lymph : No lichenification or skin changes of chronic lymphedema.  CBC No results found for: WBC, HGB, HCT, MCV, PLT  BMET    Component  Value Date/Time   BUN 18 02/25/2018 0820   CREATININE 1.05 (H) 02/25/2018 0820   GFRNONAA 54 (L) 02/25/2018 0820   GFRAA >60 02/25/2018 0820   CrCl cannot be calculated (Patient's most recent lab result is older than the maximum 21 days allowed.).  COAG No results found for: INR, PROTIME  Radiology No results found.   Assessment/Plan 1. Atherosclerosis of native artery of both lower extremities with intermittent claudication (HCC) Recommend:   The patient has evidence of atherosclerosis of the lower extremities with claudication.  The patient does not voice lifestyle limiting changes at this point in time.   Noninvasive studies do not suggest clinically significant change.   No invasive studies, angiography or surgery at this time The patient should continue walking and begin a more formal exercise program. The patient should continue antiplatelet therapy and aggressive treatment of the lipid abnormalities   No changes in the patient's medications at this time   The patient should continue wearing graduated  compression socks 10-15 mmHg strength to control the mild edema.   - VAS [place] LOWER EXTREMITY ARTERIAL DUPLEX; Future - VAS [place] ABI WITH/WO TBI; Future  2. Essential hypertension Continue antihypertensive medications as already ordered, these medications have been reviewed and there are no changes at this time.   3. Type 2 diabetes mellitus with other circulatory complication, without long-term current use of insulin (HCC) Continue hypoglycemic medications as already ordered, these medications have been reviewed and there are no changes at this time.  Hgb A1C to be monitored as already arranged by primary service   4. Mixed hyperlipidemia Continue statin as ordered and reviewed, no changes at this time    Hortencia Pilar, MD  02/15/2021 3:55 PM

## 2021-02-18 ENCOUNTER — Encounter (INDEPENDENT_AMBULATORY_CARE_PROVIDER_SITE_OTHER): Payer: Self-pay | Admitting: Vascular Surgery

## 2022-02-12 ENCOUNTER — Encounter: Payer: Self-pay | Admitting: Emergency Medicine

## 2022-02-12 ENCOUNTER — Emergency Department
Admission: EM | Admit: 2022-02-12 | Discharge: 2022-02-12 | Disposition: A | Discharge status: Home / Self Care | Payer: Medicare HMO | Attending: Emergency Medicine | Admitting: Emergency Medicine

## 2022-02-12 ENCOUNTER — Other Ambulatory Visit: Payer: Self-pay

## 2022-02-12 DIAGNOSIS — H1131 Conjunctival hemorrhage, right eye: Secondary | ICD-10-CM | POA: Insufficient documentation

## 2022-02-12 DIAGNOSIS — H5789 Other specified disorders of eye and adnexa: Secondary | ICD-10-CM | POA: Diagnosis present

## 2022-02-12 NOTE — Discharge Instructions (Signed)
Please seek medical attention for any high fevers, chest pain, shortness of breath, change in behavior, persistent vomiting, bloody stool or any other new or concerning symptoms.  

## 2022-02-12 NOTE — ED Triage Notes (Signed)
Pt to ED via POV, pt states that she was at [facility] prior to coming to the ED, pt states that someone asked her what was wrong with her eye. Pt reports that she felt like something was in her eye. Pt has swelling and redness to the conjunctiva. Pt denies injury. Pt states that she can still see out of her eye.

## 2022-02-12 NOTE — ED Notes (Signed)
See triage note re: eye irritation. EDP already saw pt at bedside.

## 2022-02-12 NOTE — ED Notes (Signed)
See triage note  presents with right eye irritation  denies ani injury

## 2022-02-12 NOTE — ED Provider Notes (Signed)
   [facility] Provider Note    Event Date/Time   First MD Initiated Contact with Patient 02/12/22 1236     (approximate)   History   Eye Problem   HPI  Sherri Dawson is a 71 y.o. female who presents to the emergency department today because of concerns for right eye concerns.  Patient states that she was out today when some is noticed that her right eye was turning red.  Patient denies any trauma to her eye.  She denies any change in her vision.  Patient states she is on blood thinning medication.     Physical Exam   Triage Vital Signs: ED Triage Vitals  Enc Vitals Group     BP 02/12/22 1231 (!) 158/81     Pulse Rate 02/12/22 1231 88     Resp 02/12/22 1231 16     Temp 02/12/22 1235 98.3 F (36.8 C)     Temp Source 02/12/22 1235 Oral     SpO2 02/12/22 1231 99 %     Weight 02/12/22 1232 140 lb (63.5 kg)     Height 02/12/22 1232 '5\' 4"'$  (1.626 m)     Head Circumference --      Peak Flow --      Pain Score 02/12/22 1232 0   Most recent vital signs: Vitals:   02/12/22 1231 02/12/22 1235  BP: (!) 158/81   Pulse: 88   Resp: 16   Temp:  98.3 F (36.8 C)  SpO2: 99%    General: Awake, alert, oriented. CV:  Good peripheral perfusion.  Resp:  Normal effort.  Abd:  No distention.  Other:  Large subconjunctival hematoma of the right eye. PERRL.    ED Results / Procedures / Treatments   Labs (all labs ordered are listed, but only abnormal results are displayed) Labs Reviewed - No data to display   EKG  None   RADIOLOGY None   PROCEDURES:  Critical Care performed: No  Procedures   MEDICATIONS ORDERED IN ED: Medications - No data to display   IMPRESSION / MDM / [place] / ED COURSE  I reviewed the triage vital signs and the nursing notes.                              Differential diagnosis includes, but is not limited to, subconjunctival hematoma, trauma.   Patient's presentation is most consistent with acute  presentation with potential threat to life or bodily function.  Patient presented to the emergency department today because of concerns for redness around her right eye.  On exam she has a supple conjunctival hemorrhage.  I discussed this with the patient.  At this time it appears spontaneous given lack of trauma.  Patient is on blood thinners.  Discussed eye care with patient.  FINAL CLINICAL IMPRESSION(S) / ED DIAGNOSES   Final diagnoses:  Subconjunctival hemorrhage of right eye    Note:  This document was prepared using Dragon voice recognition software and may include unintentional dictation errors.    Nance Pear, MD 02/12/22 [phone]

## 2022-02-20 ENCOUNTER — Other Ambulatory Visit (INDEPENDENT_AMBULATORY_CARE_PROVIDER_SITE_OTHER): Payer: Self-pay | Admitting: Vascular Surgery

## 2022-02-20 DIAGNOSIS — I6529 Occlusion and stenosis of unspecified carotid artery: Secondary | ICD-10-CM | POA: Insufficient documentation

## 2022-02-20 DIAGNOSIS — Z9889 Other specified postprocedural states: Secondary | ICD-10-CM

## 2022-02-20 NOTE — Progress Notes (Signed)
MRN : [id number]  Sherri Dawson is a 71 y.o. ([birth year]) female who presents with chief complaint of check circulation.  History of Present Illness:   The patient returns to the office for followup and review of the noninvasive studies. There have been no interval changes in lower extremity symptoms. No interval shortening of the patient's claudication distance or development of rest pain symptoms. No new ulcers or wounds have occurred since the last visit.   Angiogram with intervention on 02/25/2018: 1.  Crosser atherectomy left SFA and popliteal            2.  Percutaneous transluminal angioplasty and stent placement superficial femoral and popliteal arteries to 6 to 7 mm with life stents and Lutonix drug-eluting balloons 3.  Percutaneous transluminal angioplasty the left posterior tibial artery to 2.5 mm with an Ultraverse balloon   There have been no significant changes to the patient's overall health care.   The patient denies amaurosis fugax or recent TIA symptoms. There are no recent neurological changes noted. The patient denies history of DVT, PE or superficial thrombophlebitis. The patient denies recent episodes of angina or shortness of breath.    ABI's Rt=1.15 and Lt=1.20 (previous ABI's Rt=1.27 and Lt=1.20).  Duplex ultrasound of the left lower extremity shows widely patent arterial system with biphasic signals through out and a widely patent stent  Previous carotid duplex ultrasound dated 07/03/2016 demonstrated 1 to 39% diameter reduction bilateral internal carotid arteries  No outpatient medications have been marked as taking for the 02/21/22 encounter (Appointment) with Katha Cabal, MD.    Past Medical History:  Diagnosis Date   Atherosclerosis    B12 deficiency    Diabetes mellitus without complication ([place])    Hyperlipidemia    Hypertension    PAD (peripheral artery disease) ([place])     Past Surgical History:  Procedure  Laterality Date   COLONOSCOPY WITH PROPOFOL N/A 04/06/2018   Procedure: COLONOSCOPY WITH PROPOFOL;  Surgeon: Manya Silvas, MD;  Location[facility] ENDOSCOPY;  Service: Endoscopy;  Laterality: N/A;   LOWER EXTREMITY ANGIOGRAPHY Left 02/25/2018   Procedure: LOWER EXTREMITY ANGIOGRAPHY;  Surgeon: Katha Cabal, MD;  Location: [place] [facility];  Service: Cardiovascular;  Laterality: Left;   PERIPHERAL VASCULAR CATHETERIZATION Left 06/19/2016   Procedure: Lower Extremity Angiography;  Surgeon: Katha Cabal, MD;  Location: [place] [facility];  Service: Cardiovascular;  Laterality: Left;    Social History Social History   Tobacco Use   Smoking status: Former    Packs/day: 0.50    Years: 45.00    Total pack years: 22.50    Types: Cigarettes    Quit date: 06/18/2016    Years since quitting: 5.6   Smokeless tobacco: Never  Vaping Use   Vaping Use: Never used  Substance Use Topics   Alcohol use: Yes   Drug use: No    Family History Family History  Problem Relation Age of Onset   Heart attack Father    Breast cancer Maternal Aunt     No Known Allergies   REVIEW OF SYSTEMS (Negative unless checked)  Constitutional: '[]'$ Weight loss  '[]'$ Fever  '[]'$ Chills Cardiac: '[]'$ Chest pain   '[]'$ Chest pressure   '[]'$ Palpitations   '[]'$ Shortness of breath when laying flat   '[]'$ Shortness of breath with exertion. Vascular:  '[x]'$ Pain in legs with walking   '[]'$ Pain in legs at rest  '[]'$ History of DVT   '[]'$   Phlebitis   '[]'$ Swelling in legs   '[]'$ Varicose veins   '[]'$ Non-healing ulcers Pulmonary:   '[]'$ Uses home oxygen   '[]'$ Productive cough   '[]'$ Hemoptysis   '[]'$ Wheeze  '[]'$ COPD   '[]'$ Asthma Neurologic:  '[]'$ Dizziness   '[]'$ Seizures   '[]'$ History of stroke   '[]'$ History of TIA  '[]'$ Aphasia   '[]'$ Vissual changes   '[]'$ Weakness or numbness in arm   '[]'$ Weakness or numbness in leg Musculoskeletal:   '[]'$ Joint swelling   '[]'$ Joint pain   '[]'$ Low back pain Hematologic:  '[]'$ Easy bruising  '[]'$ Easy bleeding   '[]'$ Hypercoagulable state    '[]'$ Anemic Gastrointestinal:  '[]'$ Diarrhea   '[]'$ Vomiting  '[]'$ Gastroesophageal reflux/heartburn   '[]'$ Difficulty swallowing. Genitourinary:  '[]'$ Chronic kidney disease   '[]'$ Difficult urination  '[]'$ Frequent urination   '[]'$ Blood in urine Skin:  '[]'$ Rashes   '[]'$ Ulcers  Psychological:  '[]'$ History of anxiety   '[]'$  History of major depression.  Physical Examination  There were no vitals filed for this visit. There is no height or weight on file to calculate BMI. Gen: WD/WN, NAD Head: [place]/AT, No temporalis wasting.  Ear/Nose/Throat: Hearing grossly intact, nares w/o erythema or drainage Eyes: PER, EOMI, sclera nonicteric.  Neck: Supple, no masses.  No bruit or JVD.  Pulmonary:  Good air movement, no audible wheezing, no use of accessory muscles.  Cardiac: RRR, normal S1, S2, no Murmurs. Vascular:  mild trophic changes, no open wounds Vessel Right Left  Radial Palpable Palpable  PT  Palpable Palpable  DP  Palpable Not Palpable  Gastrointestinal: soft, non-distended. No guarding/no peritoneal signs.  Musculoskeletal: M/S 5/5 throughout.  No visible deformity.  Neurologic: CN 2-12 intact. Pain and light touch intact in extremities.  Symmetrical.  Speech is fluent. Motor exam as listed above. Psychiatric: Judgment intact, Mood & affect appropriate for pt's clinical situation. Dermatologic: No rashes or ulcers noted.  No changes consistent with cellulitis.   CBC No results found for: "WBC", "HGB", "HCT", "MCV", "PLT"  BMET    Component Value Date/Time   BUN 18 02/25/2018 0820   CREATININE 1.05 (H) 02/25/2018 0820   GFRNONAA 54 (L) 02/25/2018 0820   GFRAA >60 02/25/2018 0820   CrCl cannot be calculated (Patient's most recent lab result is older than the maximum 21 days allowed.).  COAG No results found for: "INR", "PROTIME"  Radiology No results found.   Assessment/Plan 1. Atherosclerosis of native artery of both lower extremities with intermittent claudication (HCC)  Recommend:  The patient has  evidence of atherosclerosis of the lower extremities with claudication.  The patient does not voice lifestyle limiting changes at this point in time.  Noninvasive studies do not suggest clinically significant change.  No invasive studies, angiography or surgery at this time The patient should continue walking and begin a more formal exercise program.  The patient should continue antiplatelet therapy and aggressive treatment of the lipid abnormalities  No changes in the patient's medications at this time  Continued surveillance is indicated as atherosclerosis is likely to progress with time.    The patient will continue follow up with noninvasive studies as ordered.   - VAS [place] ABI WITH/WO TBI; Future  2. Bilateral carotid artery stenosis Recommend:  Given the patient's asymptomatic subcritical stenosis no further invasive testing or surgery at this time.  Duplex ultrasound shows 1-39% stenosis bilaterally.  Continue antiplatelet therapy as prescribed Continue management of CAD, HTN and Hyperlipidemia Healthy heart diet,  encouraged exercise at least 4 times per week Follow up in 24 months with duplex ultrasound and physical exam  3. Essential hypertension Continue antihypertensive medications as already ordered, these medications have been reviewed and there are no changes at this time.   4. Type 2 diabetes mellitus with other circulatory complication, without long-term current use of insulin (HCC) Continue hypoglycemic medications as already ordered, these medications have been reviewed and there are no changes at this time.  Hgb A1C to be monitored as already arranged by primary service   5. Mixed hyperlipidemia Continue statin as ordered and reviewed, no changes at this time     Hortencia Pilar, MD  02/20/2022 5:13 PM

## 2022-02-21 ENCOUNTER — Encounter (INDEPENDENT_AMBULATORY_CARE_PROVIDER_SITE_OTHER): Payer: Self-pay | Admitting: Vascular Surgery

## 2022-02-21 ENCOUNTER — Ambulatory Visit (INDEPENDENT_AMBULATORY_CARE_PROVIDER_SITE_OTHER): Payer: Medicare HMO

## 2022-02-21 ENCOUNTER — Ambulatory Visit (INDEPENDENT_AMBULATORY_CARE_PROVIDER_SITE_OTHER): Payer: Medicare HMO | Admitting: Vascular Surgery

## 2022-02-21 VITALS — BP 124/80 | HR 83 | Resp 17 | Ht 64.0 in | Wt 146.0 lb

## 2022-02-21 DIAGNOSIS — I10 Essential (primary) hypertension: Secondary | ICD-10-CM | POA: Diagnosis not present

## 2022-02-21 DIAGNOSIS — I739 Peripheral vascular disease, unspecified: Secondary | ICD-10-CM

## 2022-02-21 DIAGNOSIS — I6523 Occlusion and stenosis of bilateral carotid arteries: Secondary | ICD-10-CM | POA: Diagnosis not present

## 2022-02-21 DIAGNOSIS — I70213 Atherosclerosis of native arteries of extremities with intermittent claudication, bilateral legs: Secondary | ICD-10-CM

## 2022-02-21 DIAGNOSIS — E1159 Type 2 diabetes mellitus with other circulatory complications: Secondary | ICD-10-CM

## 2022-02-21 DIAGNOSIS — Z9889 Other specified postprocedural states: Secondary | ICD-10-CM

## 2022-02-21 DIAGNOSIS — Z95828 Presence of other vascular implants and grafts: Secondary | ICD-10-CM

## 2022-02-21 DIAGNOSIS — Z87891 Personal history of nicotine dependence: Secondary | ICD-10-CM

## 2022-02-21 DIAGNOSIS — E782 Mixed hyperlipidemia: Secondary | ICD-10-CM

## 2022-02-22 ENCOUNTER — Encounter (INDEPENDENT_AMBULATORY_CARE_PROVIDER_SITE_OTHER): Payer: Self-pay | Admitting: Vascular Surgery

## 2023-02-17 ENCOUNTER — Ambulatory Visit (INDEPENDENT_AMBULATORY_CARE_PROVIDER_SITE_OTHER): Payer: Medicare HMO | Admitting: Vascular Surgery

## 2023-02-17 ENCOUNTER — Ambulatory Visit (INDEPENDENT_AMBULATORY_CARE_PROVIDER_SITE_OTHER): Payer: Medicare HMO

## 2023-02-17 DIAGNOSIS — I70213 Atherosclerosis of native arteries of extremities with intermittent claudication, bilateral legs: Secondary | ICD-10-CM

## 2023-02-19 LAB — VAS US ABI WITH/WO TBI
Left ABI: 1.11
Right ABI: 1.18

## 2023-11-21 ENCOUNTER — Other Ambulatory Visit: Payer: Self-pay | Admitting: Cardiology

## 2023-11-21 DIAGNOSIS — R9430 Abnormal result of cardiovascular function study, unspecified: Secondary | ICD-10-CM

## 2023-11-21 DIAGNOSIS — I739 Peripheral vascular disease, unspecified: Secondary | ICD-10-CM

## 2023-11-21 DIAGNOSIS — I252 Old myocardial infarction: Secondary | ICD-10-CM

## 2023-11-27 ENCOUNTER — Encounter: Payer: Self-pay | Admitting: Gastroenterology

## 2023-12-01 ENCOUNTER — Other Ambulatory Visit

## 2023-12-04 ENCOUNTER — Other Ambulatory Visit: Payer: Self-pay

## 2023-12-04 ENCOUNTER — Ambulatory Visit: Admitting: Certified Registered"

## 2023-12-04 ENCOUNTER — Encounter: Payer: Self-pay | Admitting: Gastroenterology

## 2023-12-04 ENCOUNTER — Encounter
Admission: RE | Disposition: A | Discharge status: Home / Self Care | Payer: Self-pay | Source: Home / Self Care | Attending: Gastroenterology

## 2023-12-04 ENCOUNTER — Ambulatory Visit
Admission: RE | Admit: 2023-12-04 | Discharge: 2023-12-04 | Disposition: A | Discharge status: Home / Self Care | Attending: Gastroenterology | Admitting: Gastroenterology

## 2023-12-04 DIAGNOSIS — I10 Essential (primary) hypertension: Secondary | ICD-10-CM | POA: Diagnosis not present

## 2023-12-04 DIAGNOSIS — Z1211 Encounter for screening for malignant neoplasm of colon: Secondary | ICD-10-CM | POA: Diagnosis present

## 2023-12-04 DIAGNOSIS — E119 Type 2 diabetes mellitus without complications: Secondary | ICD-10-CM | POA: Insufficient documentation

## 2023-12-04 DIAGNOSIS — K5730 Diverticulosis of large intestine without perforation or abscess without bleeding: Secondary | ICD-10-CM | POA: Diagnosis not present

## 2023-12-04 DIAGNOSIS — Z860101 Personal history of adenomatous and serrated colon polyps: Secondary | ICD-10-CM | POA: Insufficient documentation

## 2023-12-04 DIAGNOSIS — Z7984 Long term (current) use of oral hypoglycemic drugs: Secondary | ICD-10-CM | POA: Insufficient documentation

## 2023-12-04 DIAGNOSIS — Z7902 Long term (current) use of antithrombotics/antiplatelets: Secondary | ICD-10-CM | POA: Diagnosis not present

## 2023-12-04 HISTORY — PX: COLONOSCOPY: SHX5424

## 2023-12-04 LAB — GLUCOSE, CAPILLARY: Glucose-Capillary: 85 mg/dL (ref 70–99)

## 2023-12-04 SURGERY — COLONOSCOPY
Anesthesia: General

## 2023-12-04 MED ORDER — ESMOLOL HCL 100 MG/10ML IV SOLN
INTRAVENOUS | Status: DC | PRN
  Administered 2023-12-04 (×2): 10 mg via INTRAVENOUS

## 2023-12-04 MED ORDER — PROPOFOL 10 MG/ML IV BOLUS
INTRAVENOUS | Status: DC | PRN
  Administered 2023-12-04: 30 mg via INTRAVENOUS
  Administered 2023-12-04: 50 mg via INTRAVENOUS

## 2023-12-04 MED ORDER — LIDOCAINE HCL (CARDIAC) PF 100 MG/5ML IV SOSY
PREFILLED_SYRINGE | INTRAVENOUS | Status: DC | PRN
  Administered 2023-12-04: 60 mg via INTRAVENOUS

## 2023-12-04 MED ORDER — PROPOFOL 500 MG/50ML IV EMUL
INTRAVENOUS | Status: DC | PRN
  Administered 2023-12-04: 155 ug/kg/min via INTRAVENOUS

## 2023-12-04 MED ORDER — SODIUM CHLORIDE 0.9 % IV SOLN: INTRAVENOUS | Status: DC

## 2023-12-04 NOTE — H&P (Signed)
 Pre-Procedure H&P   Patient ID: Sherri Dawson is a 73 y.o. female.  Gastroenterology Provider: Quintin Buckle, DO  Referring Provider: Rodena Citron, NP PCP: Sari Cunning, MD  Date: 12/04/2023  HPI Sherri Dawson is a 73 y.o. female who presents today for Colonoscopy for Personal history colon polyp .  No family history of colon cancer or colon polyps.  Last underwent colonoscopy 2019 with 1 adenomatous polyp and multiple other hyperplastic polyps.  Patient currently denies any GI symptoms  Plavix  has been held for this procedure (last taken 7 days ago)  Creatinine 0.8 hemoglobin 15 MCV 88.6 platelets 219,000   Past Medical History:  Diagnosis Date   Atherosclerosis    B12 deficiency    Diabetes mellitus without complication (HCC)    Hyperlipidemia    Hypertension    PAD (peripheral artery disease) (HCC)     Past Surgical History:  Procedure Laterality Date   CESAREAN SECTION     COLONOSCOPY WITH PROPOFOL  N/A 04/06/2018   Procedure: COLONOSCOPY WITH PROPOFOL ;  Surgeon: Cassie Click, MD;  Location: [facility] ENDOSCOPY;  Service: Endoscopy;  Laterality: N/A;   LOWER EXTREMITY ANGIOGRAPHY Left 02/25/2018   Procedure: LOWER EXTREMITY ANGIOGRAPHY;  Surgeon: Jackquelyn Mass, MD;  Location: [facility] INVASIVE CV LAB;  Service: Cardiovascular;  Laterality: Left;   PERIPHERAL VASCULAR CATHETERIZATION Left 06/19/2016   Procedure: Lower Extremity Angiography;  Surgeon: Jackquelyn Mass, MD;  Location: [facility] INVASIVE CV LAB;  Service: Cardiovascular;  Laterality: Left;    Family History No h/o GI disease or malignancy  Review of Systems  Constitutional:  Negative for activity change, appetite change, chills, diaphoresis, fatigue, fever and unexpected weight change.  HENT:  Negative for trouble swallowing and voice change.   Respiratory:  Negative for shortness of breath and wheezing.   Cardiovascular:  Negative for chest pain, palpitations and leg swelling.   Gastrointestinal:  Negative for abdominal distention, abdominal pain, anal bleeding, blood in stool, constipation, diarrhea, nausea, rectal pain and vomiting.  Musculoskeletal:  Negative for arthralgias and myalgias.  Skin:  Negative for color change and pallor.  Neurological:  Negative for dizziness, syncope and weakness.  Psychiatric/Behavioral:  Negative for confusion.   All other systems reviewed and are negative.    Medications No current facility-administered medications on file prior to encounter.   Current Outpatient Medications on File Prior to Encounter  Medication Sig Dispense Refill   levothyroxine (SYNTHROID) 100 MCG tablet Take 50 mcg by mouth daily before breakfast.     metFORMIN (GLUCOPHAGE) 500 MG tablet Take 500 mg by mouth daily with breakfast.     olmesartan-hydrochlorothiazide (BENICAR HCT) 20-12.5 MG tablet Take 1 tablet by mouth daily.      rosuvastatin (CRESTOR) 10 MG tablet Take 1 tablet by mouth daily.     aspirin  EC 81 MG tablet Take 81 mg by mouth daily.  (Patient not taking: Reported on 12/04/2023)     aspirin -acetaminophen -caffeine (EXCEDRIN MIGRAINE) 250-250-65 MG tablet Take 1 tablet by mouth every 6 (six) hours as needed for headache.      clopidogrel  (PLAVIX ) 75 MG tablet TAKE 1 TABLET BY MOUTH DAILY 90 tablet 4   Cyanocobalamin (VITAMIN B-12) 5000 MCG TBDP Take 5,000 mcg by mouth daily.     rosuvastatin (CRESTOR) 10 MG tablet Take by mouth.      Pertinent medications related to GI and procedure were reviewed by me with the patient prior to the procedure   Current Facility-Administered Medications:  0.9 %  sodium chloride  infusion, , Intravenous, Continuous, Quintin Buckle, DO, Last Rate: 20 mL/hr at 12/04/23 1247, Continued from Pre-op at 12/04/23 1247  sodium chloride  20 mL/hr at 12/04/23 1247       No Known Allergies Allergies were reviewed by me prior to the procedure  Objective   Body mass index is 23 kg/m. Vitals:   12/04/23  1308  BP: (!) 159/101  Pulse: 98  Resp: 14  Temp: (!) 97 F (36.1 C)  TempSrc: Temporal  SpO2: 100%  Weight: 60.8 kg  Height: 5\' 4"  (1.626 m)     Physical Exam Vitals and nursing note reviewed.  Constitutional:      General: She is not in acute distress.    Appearance: Normal appearance. She is not ill-appearing, toxic-appearing or diaphoretic.  HENT:     Head: Normocephalic and atraumatic.     Nose: Nose normal.     Mouth/Throat:     Mouth: Mucous membranes are moist.     Pharynx: Oropharynx is clear.  Eyes:     General: No scleral icterus.    Extraocular Movements: Extraocular movements intact.  Cardiovascular:     Rate and Rhythm: Regular rhythm. Tachycardia present.     Heart sounds: Normal heart sounds. No murmur heard.    No friction rub. No gallop.  Pulmonary:     Effort: Pulmonary effort is normal. No respiratory distress.     Breath sounds: Normal breath sounds. No wheezing, rhonchi or rales.  Abdominal:     General: Bowel sounds are normal. There is no distension.     Palpations: Abdomen is soft.     Tenderness: There is no abdominal tenderness. There is no guarding or rebound.  Musculoskeletal:     Cervical back: Neck supple.     Right lower leg: No edema.     Left lower leg: No edema.  Skin:    General: Skin is warm and dry.     Coloration: Skin is not jaundiced or pale.  Neurological:     General: No focal deficit present.     Mental Status: She is alert and oriented to person, place, and time. Mental status is at baseline.  Psychiatric:        Mood and Affect: Mood normal.        Behavior: Behavior normal.        Thought Content: Thought content normal.        Judgment: Judgment normal.      Assessment:  Sherri Dawson is a 73 y.o. female  who presents today for Colonoscopy for Personal history colon polyp .  Plan:  Colonoscopy with possible intervention today  Colonoscopy with possible biopsy, control of bleeding, polypectomy, and  interventions as necessary has been discussed with the patient/patient representative. Informed consent was obtained from the patient/patient representative after explaining the indication, nature, and risks of the procedure including but not limited to death, bleeding, perforation, missed neoplasm/lesions, cardiorespiratory compromise, and reaction to medications. Opportunity for questions was given and appropriate answers were provided. Patient/patient representative has verbalized understanding is amenable to undergoing the procedure.   Quintin Buckle, DO  [facility]  Portions of the record may have been created with voice recognition software. Occasional wrong-word or 'sound-a-like' substitutions may have occurred due to the inherent limitations of voice recognition software.  Read the chart carefully and recognize, using context, where substitutions may have occurred.

## 2023-12-04 NOTE — Anesthesia Procedure Notes (Signed)
 Procedure Name: General with mask airway Date/Time: 12/04/2023 1:27 PM  Performed by: Niki Barter, CRNAPre-anesthesia Checklist: Patient identified, Emergency Drugs available, Suction available and Patient being monitored Patient Re-evaluated:Patient Re-evaluated prior to induction Oxygen Delivery Method: Simple face mask Induction Type: IV induction Placement Confirmation: positive ETCO2 and breath sounds checked- equal and bilateral Dental Injury: Teeth and Oropharynx as per pre-operative assessment

## 2023-12-04 NOTE — Anesthesia Preprocedure Evaluation (Signed)
 Anesthesia Evaluation  Patient identified by MRN, date of birth, ID band Patient awake    Reviewed: Allergy & Precautions, NPO status , Patient's Chart, lab work & pertinent test results  History of Anesthesia Complications Negative for: history of anesthetic complications  Airway Mallampati: III       Dental  (+) Dental Advidsory Given, Caps, Missing   Pulmonary neg shortness of breath, neg sleep apnea, neg COPD, neg recent URI, former smoker          Cardiovascular hypertension, Pt. on medications (-) angina + Peripheral Vascular Disease  (-) Past MI, (-) Cardiac Stents and (-) CHF (-) dysrhythmias (-) Valvular Problems/Murmurs     Neuro/Psych neg Seizures negative neurological ROS  negative psych ROS   GI/Hepatic Neg liver ROS,neg GERD  ,,  Endo/Other  diabetes, Well Controlled, Type 2, Oral Hypoglycemic Agents    Renal/GU negative Renal ROS     Musculoskeletal   Abdominal   Peds  Hematology   Anesthesia Other Findings Past Medical History: No date: Atherosclerosis No date: B12 deficiency No date: Diabetes mellitus without complication (HCC) No date: Hyperlipidemia No date: Hypertension No date: PAD (peripheral artery disease) (HCC)   Reproductive/Obstetrics                             Anesthesia Physical Anesthesia Plan  ASA: 2  Anesthesia Plan: General   Post-op Pain Management:    Induction: Intravenous  PONV Risk Score and Plan: 3 and TIVA, Propofol  infusion and Treatment may vary due to age or medical condition  Airway Management Planned: Nasal Cannula and Natural Airway  Additional Equipment:   Intra-op Plan:   Post-operative Plan:   Informed Consent: I have reviewed the patients History and Physical, chart, labs and discussed the procedure including the risks, benefits and alternatives for the proposed anesthesia with the patient or authorized representative who  has indicated his/her understanding and acceptance.       Plan Discussed with:   Anesthesia Plan Comments:         Anesthesia Quick Evaluation

## 2023-12-04 NOTE — Op Note (Signed)
 [facility] Patient Name: Sherri Dawson Procedure Date: 12/04/2023 1:07 PM MRN: [id number] Account #: [id number] Date of Birth: [birth year] Admit Type: Outpatient Age: 73 Room: [facility] ENDO ROOM 1 Gender: Female Note Status: Finalized Instrument Name: Peds Colonoscope [id number] Procedure:             Colonoscopy Indications:           High risk colon cancer surveillance: Personal history                         of colonic polyps Providers:             Quintin Buckle DO, DO Medicines:             Monitored Anesthesia Care Complications:         No immediate complications. Estimated blood loss: None. Procedure:             Pre-Anesthesia Assessment:                        - Prior to the procedure, a History and Physical was                         performed, and patient medications and allergies were                         reviewed. The patient is competent. The risks and                         benefits of the procedure and the sedation options and                         risks were discussed with the patient. All questions                         were answered and informed consent was obtained.                         Patient identification and proposed procedure were                         verified by the physician, the nurse, the anesthetist                         and the technician in the endoscopy suite. Mental                         Status Examination: alert and oriented. Airway                         Examination: normal oropharyngeal airway and neck                         mobility. Respiratory Examination: clear to                         auscultation. CV Examination: RRR, no murmurs, no S3                         or S4.  Prophylactic Antibiotics: The patient does not                         require prophylactic antibiotics. Prior                         Anticoagulants: The patient has taken Plavix                           (clopidogrel ), last dose was 7 days prior to                         procedure. ASA Grade Assessment: II - A patient with                         mild systemic disease. After reviewing the risks and                         benefits, the patient was deemed in satisfactory                         condition to undergo the procedure. The anesthesia                         plan was to use monitored anesthesia care (MAC).                         Immediately prior to administration of medications,                         the patient was re-assessed for adequacy to receive                         sedatives. The heart rate, respiratory rate, oxygen                         saturations, blood pressure, adequacy of pulmonary                         ventilation, and response to care were monitored                         throughout the procedure. The physical status of the                         patient was re-assessed after the procedure.                        After obtaining informed consent, the colonoscope was                         passed under direct vision. Throughout the procedure,                         the patient's blood pressure, pulse, and oxygen                         saturations were monitored continuously. The  Colonoscope was introduced through the anus and                         advanced to the the terminal ileum, with                         identification of the appendiceal orifice and IC                         valve. The colonoscopy was somewhat difficult due to                         multiple diverticula in the colon. Successful                         completion of the procedure was aided by applying                         abdominal pressure and lavage. The patient tolerated                         the procedure well. The quality of the bowel                         preparation was evaluated using the BBPS [facility] Bowel                          Preparation Scale) with scores of: Right Colon = 2                         (minor amount of residual staining, small fragments of                         stool and/or opaque liquid, but mucosa seen well),                         Transverse Colon = 3 (entire mucosa seen well with no                         residual staining, small fragments of stool or opaque                         liquid) and Left Colon = 2 (minor amount of residual                         staining, small fragments of stool and/or opaque                         liquid, but mucosa seen well). The total BBPS score                         equals 7. The quality of the bowel preparation was                         good. The terminal ileum, ileocecal valve, appendiceal  orifice, and rectum were photographed. Findings:      The perianal and digital rectal examinations were normal. Pertinent       negatives include normal sphincter tone.      The terminal ileum appeared normal. Estimated blood loss: none.      Multiple small-mouthed diverticula were found in the left colon.       Estimated blood loss: none.      The exam was otherwise without abnormality on direct and retroflexion       views. Impression:            - The examined portion of the ileum was normal.                        - Diverticulosis in the left colon.                        - The examination was otherwise normal on direct and                         retroflexion views.                        - No specimens collected. Recommendation:        - Patient has a contact number available for                         emergencies. The signs and symptoms of potential                         delayed complications were discussed with the patient.                         Return to normal activities tomorrow. Written                         discharge instructions were provided to the patient.                        - Discharge patient to home.                         - Resume previous diet.                        - Continue present medications.                        - Resume Plavix  (clopidogrel ) at prior dose today.                         Refer to managing physician for further adjustment of                         therapy.                        - Repeat colonoscopy in 5 years for surveillance.                        - Return to referring physician as previously  scheduled.                        - The findings and recommendations were discussed with                         the patient. Procedure Code(s):     --- Professional ---                        [phone], Colonoscopy, flexible; diagnostic, including                         collection of specimen(s) by brushing or washing, when                         performed (separate procedure) Diagnosis Code(s):     --- Professional ---                        Z86.010, Personal history of colonic polyps                        K57.30, Diverticulosis of large intestine without                         perforation or abscess without bleeding CPT copyright 2022 American Medical Association. All rights reserved. The codes documented in this report are preliminary and upon coder review may  be revised to meet current compliance requirements. Attending Participation:      I personally performed the entire procedure. Polo Brisk, DO Quintin Buckle DO, DO 12/04/2023 1:40:28 PM This report has been signed electronically. Number of Addenda: 0 Note Initiated On: 12/04/2023 1:07 PM Scope Withdrawal Time: 0 hours 7 minutes 23 seconds  Total Procedure Duration: 0 hours 16 minutes 24 seconds  Estimated Blood Loss:  Estimated blood loss: none.      [facility]

## 2023-12-04 NOTE — Transfer of Care (Signed)
 Immediate Anesthesia Transfer of Care Note  Patient: Sherri Dawson  Procedure(s) Performed: COLONOSCOPY  Patient Location: Endoscopy Unit  Anesthesia Type:General  Level of Consciousness: awake, drowsy, and patient cooperative  Airway & Oxygen Therapy: Patient Spontanous Breathing and Patient connected to face mask oxygen  Post-op Assessment: Report given to RN and Post -op Vital signs reviewed and stable  Post vital signs: Reviewed and stable  Last Vitals:  Vitals Value Taken Time  BP 98/69 12/04/23 1342  Temp 35.6 C 12/04/23 1342  Pulse 114 12/04/23 1342  Resp 18 12/04/23 1342  SpO2 100 % 12/04/23 1342    Last Pain:  Vitals:   12/04/23 1342  TempSrc: Temporal  PainSc: Asleep         Complications: No notable events documented.

## 2023-12-04 NOTE — Interval H&P Note (Signed)
 History and Physical Interval Note: Preprocedure H&P from 12/04/23  was reviewed and there was no interval change after seeing and examining the patient.  Written consent was obtained from the patient after discussion of risks, benefits, and alternatives. Patient has consented to proceed with Colonoscopy with possible intervention   12/04/2023 1:11 PM  Sherri Dawson  has presented today for surgery, with the diagnosis of Hx of adenomatous colonic polyps (Z86.0101).  The various methods of treatment have been discussed with the patient and family. After consideration of risks, benefits and other options for treatment, the patient has consented to  Procedure(s): COLONOSCOPY (N/A) as a surgical intervention.  The patient's history has been reviewed, patient examined, no change in status, stable for surgery.  I have reviewed the patient's chart and labs.  Questions were answered to the patient's satisfaction.     Quintin Buckle

## 2023-12-05 ENCOUNTER — Encounter
Admission: RE | Admit: 2023-12-05 | Discharge: 2023-12-05 | Disposition: A | Discharge status: Home / Self Care | Source: Ambulatory Visit | Attending: Cardiology | Admitting: Cardiology

## 2023-12-05 ENCOUNTER — Encounter: Payer: Self-pay | Admitting: Gastroenterology

## 2023-12-05 DIAGNOSIS — I252 Old myocardial infarction: Secondary | ICD-10-CM | POA: Insufficient documentation

## 2023-12-05 DIAGNOSIS — R9430 Abnormal result of cardiovascular function study, unspecified: Secondary | ICD-10-CM | POA: Insufficient documentation

## 2023-12-05 DIAGNOSIS — I739 Peripheral vascular disease, unspecified: Secondary | ICD-10-CM | POA: Diagnosis present

## 2023-12-05 MED ORDER — REGADENOSON 0.4 MG/5ML IV SOLN
0.4000 mg | Freq: Once | INTRAVENOUS | Status: AC
  Administered 2023-12-05: 0.4 mg via INTRAVENOUS
  Filled 2023-12-05: qty 5

## 2023-12-05 MED ORDER — TECHNETIUM TC 99M TETROFOSMIN IV KIT
10.6700 | PACK | Freq: Once | INTRAVENOUS | Status: AC | PRN
  Administered 2023-12-05: 10.67 via INTRAVENOUS

## 2023-12-05 MED ORDER — TECHNETIUM TC 99M TETROFOSMIN IV KIT
30.6500 | PACK | Freq: Once | INTRAVENOUS | Status: AC | PRN
  Administered 2023-12-05: 30.65 via INTRAVENOUS

## 2023-12-09 LAB — NM MYOCAR MULTI W/SPECT W/WALL MOTION / EF
Base ST Depression (mm): 0 mm
Estimated workload: 1
Exercise duration (min): 1 min
Exercise duration (sec): 0 s
LV dias vol: 70 mL (ref 46–106)
LV sys vol: 23 mL
MPHR: 147 {beats}/min
Nuc Stress EF: 67 %
Peak HR: 102 {beats}/min
Percent HR: 69 %
Rest HR: 77 {beats}/min
Rest Nuclear Isotope Dose: 10.7 mCi
SDS: 3
SRS: 7
SSS: 5
ST Depression (mm): 0 mm
Stress Nuclear Isotope Dose: 30.7 mCi
TID: 1

## 2023-12-09 NOTE — Anesthesia Postprocedure Evaluation (Signed)
 Anesthesia Post Note  Patient: Sherri Dawson  Procedure(s) Performed: COLONOSCOPY  Patient location during evaluation: Endoscopy Anesthesia Type: General Level of consciousness: awake and alert Pain management: pain level controlled Vital Signs Assessment: post-procedure vital signs reviewed and stable Respiratory status: spontaneous breathing, nonlabored ventilation, respiratory function stable and patient connected to nasal cannula oxygen Cardiovascular status: blood pressure returned to baseline and stable Postop Assessment: no apparent nausea or vomiting Anesthetic complications: no   No notable events documented.   Last Vitals:  Vitals:   12/04/23 1342 12/04/23 1352  BP: 98/69 113/82  Pulse: (!) 114 92  Resp: 18 16  Temp: (!) 35.6 C   SpO2: 100% 100%    Last Pain:  Vitals:   12/04/23 1352  TempSrc:   PainSc: 0-No pain                 Vanice Genre

## 2023-12-23 ENCOUNTER — Encounter
Admission: RE | Disposition: A | Discharge status: Home / Self Care | Payer: Self-pay | Source: Home / Self Care | Attending: Internal Medicine

## 2023-12-23 ENCOUNTER — Encounter: Payer: Self-pay | Admitting: Internal Medicine

## 2023-12-23 ENCOUNTER — Other Ambulatory Visit: Payer: Self-pay

## 2023-12-23 ENCOUNTER — Ambulatory Visit
Admission: RE | Admit: 2023-12-23 | Discharge: 2023-12-23 | Disposition: A | Discharge status: Home / Self Care | Attending: Internal Medicine | Admitting: Internal Medicine

## 2023-12-23 DIAGNOSIS — R9430 Abnormal result of cardiovascular function study, unspecified: Secondary | ICD-10-CM

## 2023-12-23 DIAGNOSIS — Z7982 Long term (current) use of aspirin: Secondary | ICD-10-CM | POA: Diagnosis not present

## 2023-12-23 DIAGNOSIS — I2510 Atherosclerotic heart disease of native coronary artery without angina pectoris: Secondary | ICD-10-CM | POA: Diagnosis not present

## 2023-12-23 DIAGNOSIS — Z7902 Long term (current) use of antithrombotics/antiplatelets: Secondary | ICD-10-CM | POA: Diagnosis not present

## 2023-12-23 HISTORY — PX: LEFT HEART CATH AND CORONARY ANGIOGRAPHY: CATH118249

## 2023-12-23 LAB — CARDIAC CATHETERIZATION: Cath EF Quantitative: 55 %

## 2023-12-23 LAB — GLUCOSE, CAPILLARY: Glucose-Capillary: 141 mg/dL — ABNORMAL HIGH (ref 70–99)

## 2023-12-23 SURGERY — LEFT HEART CATH AND CORONARY ANGIOGRAPHY
Anesthesia: Moderate Sedation | Laterality: Left

## 2023-12-23 MED ORDER — ASPIRIN 81 MG PO CHEW: 81.0000 mg | CHEWABLE_TABLET | ORAL | Status: DC

## 2023-12-23 MED ORDER — MIDAZOLAM HCL 2 MG/2ML IJ SOLN
INTRAMUSCULAR | Status: AC
Start: 2023-12-23 — End: ?
  Filled 2023-12-23: qty 2

## 2023-12-23 MED ORDER — HEPARIN SODIUM (PORCINE) 1000 UNIT/ML IJ SOLN
INTRAMUSCULAR | Status: DC | PRN
  Administered 2023-12-23: 3000 [IU] via INTRAVENOUS

## 2023-12-23 MED ORDER — VERAPAMIL HCL 2.5 MG/ML IV SOLN
INTRAVENOUS | Status: DC | PRN
  Administered 2023-12-23: 2.5 mg via INTRAVENOUS

## 2023-12-23 MED ORDER — MIDAZOLAM HCL 2 MG/2ML IJ SOLN
INTRAMUSCULAR | Status: DC | PRN
Start: 2023-12-23 — End: 2023-12-23
  Administered 2023-12-23: .5 mg via INTRAVENOUS

## 2023-12-23 MED ORDER — VERAPAMIL HCL 2.5 MG/ML IV SOLN
INTRAVENOUS | Status: AC
  Filled 2023-12-23: qty 2

## 2023-12-23 MED ORDER — HEPARIN SODIUM (PORCINE) 1000 UNIT/ML IJ SOLN
INTRAMUSCULAR | Status: AC
  Filled 2023-12-23: qty 10

## 2023-12-23 MED ORDER — SODIUM CHLORIDE 0.9 % IV SOLN: 250.0000 mL | INTRAVENOUS | Status: DC | PRN

## 2023-12-23 MED ORDER — HEPARIN (PORCINE) IN NACL 2000-0.9 UNIT/L-% IV SOLN
INTRAVENOUS | Status: DC | PRN
Start: 2023-12-23 — End: 2023-12-23
  Administered 2023-12-23: 1000 mL

## 2023-12-23 MED ORDER — LIDOCAINE HCL (PF) 1 % IJ SOLN
INTRAMUSCULAR | Status: DC | PRN
  Administered 2023-12-23: 2 mL

## 2023-12-23 MED ORDER — IOHEXOL 300 MG/ML  SOLN
INTRAMUSCULAR | Status: DC | PRN
Start: 2023-12-23 — End: 2023-12-23
  Administered 2023-12-23: 90 mL

## 2023-12-23 MED ORDER — SODIUM CHLORIDE 0.9 % WEIGHT BASED INFUSION
3.0000 mL/kg/h | INTRAVENOUS | Status: AC
  Administered 2023-12-23: 3 mL/kg/h via INTRAVENOUS

## 2023-12-23 MED ORDER — FENTANYL CITRATE (PF) 100 MCG/2ML IJ SOLN
INTRAMUSCULAR | Status: AC
  Filled 2023-12-23: qty 2

## 2023-12-23 MED ORDER — SODIUM CHLORIDE 0.9 % WEIGHT BASED INFUSION: 1.0000 mL/kg/h | INTRAVENOUS | Status: DC

## 2023-12-23 MED ORDER — FENTANYL CITRATE (PF) 100 MCG/2ML IJ SOLN
INTRAMUSCULAR | Status: DC | PRN
  Administered 2023-12-23: 25 ug via INTRAVENOUS

## 2023-12-23 MED ORDER — SODIUM CHLORIDE 0.9% FLUSH: 3.0000 mL | Freq: Two times a day (BID) | INTRAVENOUS | Status: DC

## 2023-12-23 MED ORDER — LIDOCAINE HCL 1 % IJ SOLN
INTRAMUSCULAR | Status: AC
  Filled 2023-12-23: qty 20

## 2023-12-23 MED ORDER — SODIUM CHLORIDE 0.9% FLUSH: 3.0000 mL | INTRAVENOUS | Status: DC | PRN

## 2023-12-23 MED ORDER — HEPARIN (PORCINE) IN NACL 1000-0.9 UT/500ML-% IV SOLN
INTRAVENOUS | Status: AC
  Filled 2023-12-23: qty 1000

## 2023-12-23 MED ORDER — ASPIRIN 81 MG PO CHEW
CHEWABLE_TABLET | ORAL | Status: AC
  Filled 2023-12-23: qty 1

## 2023-12-23 MED ORDER — SODIUM CHLORIDE 0.9 % WEIGHT BASED INFUSION
1.0000 mL/kg/h | INTRAVENOUS | Status: DC
  Administered 2023-12-23: 1 mL/kg/h via INTRAVENOUS

## 2023-12-23 SURGICAL SUPPLY — 12 items
CATH INFINITI 5 FR JL3.5 (CATHETERS) IMPLANT
CATH INFINITI AMBI 5FR JK (CATHETERS) IMPLANT
CATH INFINITI JR4 5F (CATHETERS) IMPLANT
CATH LAUNCHER 5F EBU3.5 (CATHETERS) IMPLANT
DEVICE RAD TR BAND REGULAR (VASCULAR PRODUCTS) IMPLANT
DRAPE BRACHIAL (DRAPES) IMPLANT
GLIDESHEATH SLEND SS 6F .021 (SHEATH) IMPLANT
GUIDEWIRE INQWIRE 1.5J.035X260 (WIRE) IMPLANT
PACK CARDIAC CATH (CUSTOM PROCEDURE TRAY) ×1 IMPLANT
SET ATX-X65L (MISCELLANEOUS) IMPLANT
STATION PROTECTION PRESSURIZED (MISCELLANEOUS) IMPLANT
WIRE HITORQ VERSACORE ST 145CM (WIRE) IMPLANT

## 2023-12-24 ENCOUNTER — Encounter: Payer: Self-pay | Admitting: Internal Medicine

## 2024-01-14 DIAGNOSIS — Z951 Presence of aortocoronary bypass graft: Secondary | ICD-10-CM

## 2024-01-14 HISTORY — DX: Presence of aortocoronary bypass graft: Z95.1

## 2024-01-24 ENCOUNTER — Other Ambulatory Visit: Payer: Self-pay

## 2024-01-24 ENCOUNTER — Observation Stay
Admission: EM | Admit: 2024-01-24 | Discharge: 2024-01-25 | Disposition: A | Discharge status: Home / Self Care | Attending: Internal Medicine | Admitting: Internal Medicine

## 2024-01-24 ENCOUNTER — Emergency Department

## 2024-01-24 DIAGNOSIS — E119 Type 2 diabetes mellitus without complications: Secondary | ICD-10-CM | POA: Diagnosis not present

## 2024-01-24 DIAGNOSIS — E785 Hyperlipidemia, unspecified: Secondary | ICD-10-CM | POA: Insufficient documentation

## 2024-01-24 DIAGNOSIS — Z87891 Personal history of nicotine dependence: Secondary | ICD-10-CM | POA: Insufficient documentation

## 2024-01-24 DIAGNOSIS — I5030 Unspecified diastolic (congestive) heart failure: Secondary | ICD-10-CM | POA: Insufficient documentation

## 2024-01-24 DIAGNOSIS — Z8679 Personal history of other diseases of the circulatory system: Secondary | ICD-10-CM | POA: Diagnosis not present

## 2024-01-24 DIAGNOSIS — I4891 Unspecified atrial fibrillation: Principal | ICD-10-CM | POA: Diagnosis present

## 2024-01-24 DIAGNOSIS — I480 Paroxysmal atrial fibrillation: Secondary | ICD-10-CM | POA: Diagnosis not present

## 2024-01-24 DIAGNOSIS — I2510 Atherosclerotic heart disease of native coronary artery without angina pectoris: Secondary | ICD-10-CM

## 2024-01-24 DIAGNOSIS — Z7902 Long term (current) use of antithrombotics/antiplatelets: Secondary | ICD-10-CM | POA: Diagnosis not present

## 2024-01-24 DIAGNOSIS — Z7901 Long term (current) use of anticoagulants: Secondary | ICD-10-CM | POA: Diagnosis not present

## 2024-01-24 DIAGNOSIS — Z951 Presence of aortocoronary bypass graft: Secondary | ICD-10-CM | POA: Insufficient documentation

## 2024-01-24 DIAGNOSIS — R55 Syncope and collapse: Secondary | ICD-10-CM | POA: Diagnosis present

## 2024-01-24 DIAGNOSIS — Z79899 Other long term (current) drug therapy: Secondary | ICD-10-CM | POA: Insufficient documentation

## 2024-01-24 DIAGNOSIS — E039 Hypothyroidism, unspecified: Secondary | ICD-10-CM | POA: Insufficient documentation

## 2024-01-24 DIAGNOSIS — I110 Hypertensive heart disease with heart failure: Secondary | ICD-10-CM | POA: Insufficient documentation

## 2024-01-24 DIAGNOSIS — Z7982 Long term (current) use of aspirin: Secondary | ICD-10-CM | POA: Diagnosis not present

## 2024-01-24 DIAGNOSIS — I739 Peripheral vascular disease, unspecified: Secondary | ICD-10-CM | POA: Insufficient documentation

## 2024-01-24 DIAGNOSIS — Z7984 Long term (current) use of oral hypoglycemic drugs: Secondary | ICD-10-CM | POA: Insufficient documentation

## 2024-01-24 LAB — HEPARIN LEVEL (UNFRACTIONATED): Heparin Unfractionated: 0.31 [IU]/mL (ref 0.30–0.70)

## 2024-01-24 LAB — URINALYSIS, COMPLETE (UACMP) WITH MICROSCOPIC
Bilirubin Urine: NEGATIVE
Glucose, UA: 50 mg/dL — AB
Hgb urine dipstick: NEGATIVE
Ketones, ur: NEGATIVE mg/dL
Leukocytes,Ua: NEGATIVE
Nitrite: NEGATIVE
Protein, ur: NEGATIVE mg/dL
Specific Gravity, Urine: 1.02 (ref 1.005–1.030)
pH: 5 (ref 5.0–8.0)

## 2024-01-24 LAB — CBC WITH DIFFERENTIAL/PLATELET
Abs Immature Granulocytes: 0.08 10*3/uL — ABNORMAL HIGH (ref 0.00–0.07)
Basophils Absolute: 0 10*3/uL (ref 0.0–0.1)
Basophils Relative: 0 %
Eosinophils Absolute: 0.3 10*3/uL (ref 0.0–0.5)
Eosinophils Relative: 2 %
HCT: 37 % (ref 36.0–46.0)
Hemoglobin: 11.8 g/dL — ABNORMAL LOW (ref 12.0–15.0)
Immature Granulocytes: 1 %
Lymphocytes Relative: 18 %
Lymphs Abs: 2.7 10*3/uL (ref 0.7–4.0)
MCH: 28.2 pg (ref 26.0–34.0)
MCHC: 31.9 g/dL (ref 30.0–36.0)
MCV: 88.3 fL (ref 80.0–100.0)
Monocytes Absolute: 1.1 10*3/uL — ABNORMAL HIGH (ref 0.1–1.0)
Monocytes Relative: 7 %
Neutro Abs: 10.7 10*3/uL — ABNORMAL HIGH (ref 1.7–7.7)
Neutrophils Relative %: 72 %
Platelets: 503 10*3/uL — ABNORMAL HIGH (ref 150–400)
RBC: 4.19 MIL/uL (ref 3.87–5.11)
RDW: 13.5 % (ref 11.5–15.5)
WBC: 14.9 10*3/uL — ABNORMAL HIGH (ref 4.0–10.5)
nRBC: 0 % (ref 0.0–0.2)

## 2024-01-24 LAB — COMPREHENSIVE METABOLIC PANEL WITH GFR
ALT: 15 U/L (ref 0–44)
AST: 19 U/L (ref 15–41)
Albumin: 2.9 g/dL — ABNORMAL LOW (ref 3.5–5.0)
Alkaline Phosphatase: 48 U/L (ref 38–126)
Anion gap: 13 (ref 5–15)
BUN: 15 mg/dL (ref 8–23)
CO2: 25 mmol/L (ref 22–32)
Calcium: 8.3 mg/dL — ABNORMAL LOW (ref 8.9–10.3)
Chloride: 97 mmol/L — ABNORMAL LOW (ref 98–111)
Creatinine, Ser: 0.97 mg/dL (ref 0.44–1.00)
GFR, Estimated: >60 mL/min (ref >60)
Glucose, Bld: 185 mg/dL — ABNORMAL HIGH (ref 70–99)
Potassium: 3.5 mmol/L (ref 3.5–5.1)
Sodium: 135 mmol/L (ref 135–145)
Total Bilirubin: 1.1 mg/dL (ref 0.0–1.2)
Total Protein: 6.3 g/dL — ABNORMAL LOW (ref 6.5–8.1)

## 2024-01-24 LAB — PROTIME-INR
INR: 1.1 (ref 0.8–1.2)
Prothrombin Time: 14.3 s (ref 11.4–15.2)

## 2024-01-24 LAB — TROPONIN I (HIGH SENSITIVITY)
Troponin I (High Sensitivity): 23 ng/L — ABNORMAL HIGH (ref <18)
Troponin I (High Sensitivity): 23 ng/L — ABNORMAL HIGH (ref <18)

## 2024-01-24 LAB — APTT: aPTT: 26 s (ref 24–36)

## 2024-01-24 LAB — PHOSPHORUS: Phosphorus: 2.7 mg/dL (ref 2.5–4.6)

## 2024-01-24 LAB — TSH: TSH: 3.77 u[IU]/mL (ref 0.350–4.500)

## 2024-01-24 LAB — MAGNESIUM: Magnesium: 1.9 mg/dL (ref 1.7–2.4)

## 2024-01-24 MED ORDER — ASPIRIN 81 MG PO CHEW
81.0000 mg | CHEWABLE_TABLET | Freq: Every day | ORAL | Status: DC
  Administered 2024-01-24 – 2024-01-25 (×2): 81 mg via ORAL
  Filled 2024-01-24 (×2): qty 1

## 2024-01-24 MED ORDER — METFORMIN HCL 500 MG PO TABS
500.0000 mg | ORAL_TABLET | Freq: Every day | ORAL | Status: DC
  Administered 2024-01-25: 500 mg via ORAL
  Filled 2024-01-24: qty 1

## 2024-01-24 MED ORDER — ONDANSETRON HCL 4 MG/2ML IJ SOLN: 4.0000 mg | Freq: Four times a day (QID) | INTRAMUSCULAR | Status: DC | PRN

## 2024-01-24 MED ORDER — ACETAMINOPHEN 500 MG PO TABS
1000.0000 mg | ORAL_TABLET | Freq: Once | ORAL | Status: AC
  Administered 2024-01-24: 1000 mg via ORAL
  Filled 2024-01-24: qty 2

## 2024-01-24 MED ORDER — HEPARIN (PORCINE) 25000 UT/250ML-% IV SOLN
1000.0000 [IU]/h | INTRAVENOUS | Status: DC
  Administered 2024-01-24 – 2024-01-25 (×2): 1000 [IU]/h via INTRAVENOUS
  Filled 2024-01-24 (×2): qty 250

## 2024-01-24 MED ORDER — AMIODARONE HCL IN DEXTROSE 360-4.14 MG/200ML-% IV SOLN: 60.0000 mg/h | INTRAVENOUS | Status: DC

## 2024-01-24 MED ORDER — ROSUVASTATIN CALCIUM 10 MG PO TABS
10.0000 mg | ORAL_TABLET | Freq: Every day | ORAL | Status: DC
  Administered 2024-01-25: 10 mg via ORAL
  Filled 2024-01-24: qty 1

## 2024-01-24 MED ORDER — AMIODARONE HCL IN DEXTROSE 360-4.14 MG/200ML-% IV SOLN
60.0000 mg/h | INTRAVENOUS | Status: AC
  Administered 2024-01-24 (×2): 60 mg/h via INTRAVENOUS
  Filled 2024-01-24 (×2): qty 200

## 2024-01-24 MED ORDER — AMIODARONE LOAD VIA INFUSION
150.0000 mg | Freq: Once | INTRAVENOUS | Status: AC
  Administered 2024-01-24: 150 mg via INTRAVENOUS
  Filled 2024-01-24: qty 83.34

## 2024-01-24 MED ORDER — ONDANSETRON HCL 4 MG PO TABS: 4.0000 mg | ORAL_TABLET | Freq: Four times a day (QID) | ORAL | Status: DC | PRN

## 2024-01-24 MED ORDER — SODIUM CHLORIDE 0.9 % IV BOLUS
500.0000 mL | Freq: Once | INTRAVENOUS | Status: AC
  Administered 2024-01-24: 500 mL via INTRAVENOUS

## 2024-01-24 MED ORDER — MAGNESIUM SULFATE 2 GM/50ML IV SOLN
2.0000 g | Freq: Once | INTRAVENOUS | Status: AC
  Administered 2024-01-24: 2 g via INTRAVENOUS
  Filled 2024-01-24: qty 50

## 2024-01-24 MED ORDER — CLOPIDOGREL BISULFATE 75 MG PO TABS
75.0000 mg | ORAL_TABLET | Freq: Every day | ORAL | Status: DC
  Administered 2024-01-24 – 2024-01-25 (×2): 75 mg via ORAL
  Filled 2024-01-24 (×2): qty 1

## 2024-01-24 MED ORDER — AMIODARONE HCL IN DEXTROSE 360-4.14 MG/200ML-% IV SOLN
30.0000 mg/h | INTRAVENOUS | Status: DC
  Administered 2024-01-24 – 2024-01-25 (×3): 30 mg/h via INTRAVENOUS
  Filled 2024-01-24 (×2): qty 200

## 2024-01-24 MED ORDER — HEPARIN BOLUS VIA INFUSION
4000.0000 [IU] | Freq: Once | INTRAVENOUS | Status: AC
  Administered 2024-01-24: 4000 [IU] via INTRAVENOUS
  Filled 2024-01-24: qty 4000

## 2024-01-24 MED ORDER — POTASSIUM CHLORIDE CRYS ER 20 MEQ PO TBCR
40.0000 meq | EXTENDED_RELEASE_TABLET | Freq: Once | ORAL | Status: AC
  Administered 2024-01-24: 40 meq via ORAL
  Filled 2024-01-24: qty 2

## 2024-01-24 MED ORDER — AMIODARONE HCL IN DEXTROSE 360-4.14 MG/200ML-% IV SOLN: 30.0000 mg/h | INTRAVENOUS | Status: DC

## 2024-01-24 MED ORDER — ISOSORBIDE MONONITRATE ER 60 MG PO TB24
30.0000 mg | ORAL_TABLET | Freq: Every day | ORAL | Status: DC
  Administered 2024-01-24: 30 mg via ORAL
  Filled 2024-01-24: qty 1

## 2024-01-24 MED ORDER — LEVOTHYROXINE SODIUM 50 MCG PO TABS
50.0000 ug | ORAL_TABLET | Freq: Every day | ORAL | Status: DC
  Administered 2024-01-25: 50 ug via ORAL
  Filled 2024-01-24: qty 1

## 2024-01-24 NOTE — ED Triage Notes (Signed)
 pt in via [facility] from home c/o near syncope. Pt had a CABG done a week ago. EKG showing Afib-RVR. Pt has no hx of afib.  122/63 120/150 hr 98% on RA BS-152

## 2024-01-24 NOTE — ED Notes (Signed)
 Advised nurse that patient has ready bed

## 2024-01-24 NOTE — ED Notes (Signed)
 Attempted 2nd IV at this time. Unable to obtain access for Heparin  drip. Help requested.

## 2024-01-24 NOTE — ED Notes (Signed)
 Gladys Lamp Rn unable to obtain access. Pt has been stuck multiple times between 2 RNS and EMS worker. IV team order placed at this time.

## 2024-01-24 NOTE — Progress Notes (Signed)
 PHARMACY - ANTICOAGULATION CONSULT NOTE  Pharmacy Consult for heparin  infusion Indication: atrial fibrillation  No Known Allergies  Patient Measurements: Height: 5' 4 (162.6 cm) Weight: 61.7 kg (136 lb) IBW/kg (Calculated) : 54.7 HEPARIN  DW (KG): 61.7  Vital Signs: Temp: 98 F (36.7 C) (06/14 0856) BP: 98/69 (06/14 0856) Pulse Rate: 146 (06/14 0856)  Labs: Recent Labs    01/24/24 0905  HGB 11.8*  HCT 37.0  PLT 503*    CrCl cannot be calculated (Patient's most recent lab result is older than the maximum 21 days allowed.).   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 female PMH prior recent CABG, hypertension, hyperlipidemia, diabetes presents for evaluation of lightheadedness. Heparin  is being started for atrial fibrillation. A review of medical records reveals no chronic anticoagulation prior to arrival.   Goal of Therapy:  Heparin  level 0.3-0.7 units/ml Monitor platelets by anticoagulation protocol: Yes   Plan:  Give 4000 units bolus x 1 Start heparin  infusion at 1000 units/hr Check anti-Xa level in 8 hours and daily while on heparin  Continue to monitor H&H and platelets  Adalberto Acton 01/24/2024,9:25 AM

## 2024-01-24 NOTE — ED Provider Notes (Signed)
 [facility] Provider Note    None    (approximate)   History   Atrial Fibrillation  pt in via [facility] from home c/o near syncope. Pt had a CABG done a week ago. EKG showing Afib-RVR. Pt has no hx of afib.  122/63 120/150 hr 98% on RA BS-152   HPI MAHATI VAJDA is a 73 y.o. female PMH prior recent CABG, hypertension, hyperlipidemia, diabetes presents for evaluation of lightheadedness  - Patient states that about 8 AM she had just gotten up, felt very lightheaded as if she would pass out, sat on the ground.  No LOC.  No trauma.  No chest pain or shortness of breath.  Otherwise in her usual state of health.  No urinary symptoms.  Per chart review, patient was recently admitted at [facility] for CABG procedure.  Course complicated by postoperative A-fib with ABCs.  On amiodarone for this.  Is also on metoprolol  status post CABGx2 (LIMA-LAD, SVG-PDA).  On aspirin , no Plavix .  Appears amiodarone dose is 200 mg daily.     Physical Exam   Triage Vital Signs: BP 107/61 (BP Location: Left Arm)   Pulse (!) 103   Temp (!) 97.4 F (36.3 C) (Oral)   Resp 18   Ht 5' 4 (1.626 m)   Wt 61.7 kg   SpO2 99%   BMI 23.34 kg/m     Most recent vital signs: Vitals:   01/24/24 1130 01/24/24 1315  BP: 98/66 107/61  Pulse: (!) 104 (!) 103  Resp: (!) 23 18  Temp:  (!) 97.4 F (36.3 C)  SpO2: 100% 99%     General: Awake, no distress.  CV:  Good peripheral perfusion.  Tachycardic, regular rhythm, RP 2+ Resp:  Normal effort. CTAB Abd:  No distention. Nontender to deep palpation throughout Neuro:  Face symmetric, no focal motor deficit appreciated   ED Results / Procedures / Treatments   Labs (all labs ordered are listed, but only abnormal results are displayed) Labs Reviewed  CBC WITH DIFFERENTIAL/PLATELET - Abnormal; Notable for the following components:      Result Value   WBC 14.9 (*)    Hemoglobin 11.8 (*)    Platelets 503 (*)     Neutro Abs 10.7 (*)    Monocytes Absolute 1.1 (*)    Abs Immature Granulocytes 0.08 (*)    All other components within normal limits  COMPREHENSIVE METABOLIC PANEL WITH GFR - Abnormal; Notable for the following components:   Chloride 97 (*)    Glucose, Bld 185 (*)    Calcium 8.3 (*)    Total Protein 6.3 (*)    Albumin 2.9 (*)    All other components within normal limits  TROPONIN I (HIGH SENSITIVITY) - Abnormal; Notable for the following components:   Troponin I (High Sensitivity) 23 (*)    All other components within normal limits  TROPONIN I (HIGH SENSITIVITY) - Abnormal; Notable for the following components:   Troponin I (High Sensitivity) 23 (*)    All other components within normal limits  PROTIME-INR  APTT  MAGNESIUM  PHOSPHORUS  TSH  HEPARIN  LEVEL (UNFRACTIONATED)  URINALYSIS, COMPLETE (UACMP) WITH MICROSCOPIC     EKG  Ecg = A-fib, interventricular conduction delay, rate 151, no gross ST elevation or depression except for apparent early repolarization in V1, V2.  Normal axis.  QTc 511.   RADIOLOGY Radiology interpreted by myself and radiology report reviewed.  No acute pathology.    PROCEDURES:  Critical Care performed: Yes, see critical care procedure note(s)  .Critical Care  Performed by: Collis Deaner, MD Authorized by: Collis Deaner, MD   Critical care provider statement:    Critical care time (minutes):  30   Critical care time was exclusive of:  Separately billable procedures and treating other patients   Critical care was necessary to treat or prevent imminent or life-threatening deterioration of the following conditions:  Cardiac failure and circulatory failure (afib rvr requiring IV intervention)   Critical care was time spent personally by me on the following activities:  Development of treatment plan with patient or surrogate, discussions with consultants, evaluation of patient's response to treatment, examination of patient, ordering and review  of laboratory studies, ordering and review of radiographic studies, ordering and performing treatments and interventions, pulse oximetry, re-evaluation of patient's condition and review of old charts   I assumed direction of critical care for this patient from another provider in my specialty: no     Care discussed with: admitting provider      MEDICATIONS ORDERED IN ED: Medications  amiodarone (NEXTERONE) 1.8 mg/mL load via infusion 150 mg (150 mg Intravenous Bolus from Bag 01/24/24 1005)    Followed by  amiodarone (NEXTERONE PREMIX) 360-4.14 MG/200ML-% (1.8 mg/mL) IV infusion (60 mg/hr Intravenous Infusion Verify 01/24/24 1525)    Followed by  amiodarone (NEXTERONE PREMIX) 360-4.14 MG/200ML-% (1.8 mg/mL) IV infusion (30 mg/hr Intravenous New Bag/Given 01/24/24 1535)  heparin  ADULT infusion 100 units/mL (25000 units/250mL) (1,000 Units/hr Intravenous Infusion Verify 01/24/24 1525)  aspirin  chewable tablet 81 mg (81 mg Oral Given 01/24/24 1238)  isosorbide mononitrate (IMDUR) 24 hr tablet 30 mg (30 mg Oral Given 01/24/24 1334)  rosuvastatin (CRESTOR) tablet 10 mg (has no administration in time range)  levothyroxine (SYNTHROID) tablet 50 mcg (has no administration in time range)  metFORMIN (GLUCOPHAGE) tablet 500 mg (has no administration in time range)  clopidogrel  (PLAVIX ) tablet 75 mg (75 mg Oral Given 01/24/24 1334)  ondansetron  (ZOFRAN ) tablet 4 mg (has no administration in time range)    Or  ondansetron  (ZOFRAN ) injection 4 mg (has no administration in time range)  sodium chloride  0.9 % bolus 500 mL (0 mLs Intravenous Stopped 01/24/24 1057)  heparin  bolus via infusion 4,000 Units (4,000 Units Intravenous Bolus from Bag 01/24/24 1105)  potassium chloride SA (KLOR-CON M) CR tablet 40 mEq (40 mEq Oral Given 01/24/24 1238)  magnesium sulfate IVPB 2 g 50 mL (0 g Intravenous Stopped 01/24/24 1445)     IMPRESSION / MDM / ASSESSMENT AND PLAN / ED COURSE  I reviewed the triage vital signs and the  nursing notes.                              DDX/MDM/AP: Differential diagnosis includes, but is not limited to, primary arrhythmia, consider ACS, no evidence of CVA.  Consider underlying electrolyte abnormality, anemia.  Do not clinically suspect underlying UTI or other infection.  Plan: - Labs - Small bolus IV fluid - EKG - Chest x-ray - Will discuss with cardiology regarding appropriate rate control plan  Patient's presentation is most consistent with acute presentation with potential threat to life or bodily function.  The patient is on the cardiac monitor to evaluate for evidence of arrhythmia and/or significant heart rate changes.  ED course below.  Discussed with cardiology, treated with amiodarone, heart rate improved.  Started on heparin  given recurrence of A-fib.  Admitted to hospitalist  service.  Clinical Course as of 01/24/24 1629  Sat Jan 24, 2024  0610 D/w Dr. Beau Bound - Amio bolus, drip -Heparin  -Cardiology was seen patient [MM]  0907 Cardiology paged to discuss [MM]  1005 CBC with leukocytosis, nonspecific CMP reviewed, overall unremarkable [MM]  1005 CXR: IMPRESSION: No active disease.   [MM]  1010 Troponin mildly elevated, suspect type II NSTEMI in the setting of A-fib RVR.  Heparin  already ordered for A-fib per cardiology recommendation. [MM]    Clinical Course User Index [MM] Collis Deaner, MD     FINAL CLINICAL IMPRESSION(S) / ED DIAGNOSES   Final diagnoses:  Atrial fibrillation with rapid ventricular response (HCC)     Rx / DC Orders   ED Discharge Orders     None        Note:  This document was prepared using Dragon voice recognition software and may include unintentional dictation errors.   Collis Deaner, MD 01/24/24 [phone]

## 2024-01-24 NOTE — Progress Notes (Signed)
 PHARMACY - ANTICOAGULATION CONSULT NOTE  Pharmacy Consult for heparin  infusion Indication: atrial fibrillation  No Known Allergies  Patient Measurements: Height: 5' 4 (162.6 cm) Weight: 61.7 kg (136 lb) IBW/kg (Calculated) : 54.7 HEPARIN  DW (KG): 61.7  Vital Signs: Temp: 98.1 F (36.7 C) (06/14 1718) Temp Source: Oral (06/14 1718) BP: 110/72 (06/14 1718) Pulse Rate: 97 (06/14 1718)  Labs: Recent Labs    01/24/24 0905 01/24/24 1052 01/24/24 1805  HGB 11.8*  --   --   HCT 37.0  --   --   PLT 503*  --   --   APTT 26  --   --   LABPROT 14.3  --   --   INR 1.1  --   --   HEPARINUNFRC  --   --  0.31  CREATININE 0.97  --   --   TROPONINIHS 23* 23*  --     Estimated Creatinine Clearance: 44.6 mL/min (by C-G formula based on SCr of 0.97 mg/dL).   Medical History: 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   Hyperlipidemia    Hypertension    PAD (peripheral artery disease) (HCC)     Medications:  Scheduled:   aspirin   81 mg Oral Daily   clopidogrel   75 mg Oral Daily   isosorbide mononitrate  30 mg Oral Daily   [START ON 01/25/2024] levothyroxine  50 mcg Oral QAC breakfast   [START ON 01/25/2024] metFORMIN  500 mg Oral Q breakfast   [START ON 01/25/2024] rosuvastatin  10 mg Oral Daily   Infusions:   amiodarone 30 mg/hr (01/24/24 1751)   heparin  1,000 Units/hr (01/24/24 1751)    Assessment: 73 y.o. female PMH prior recent CABG, hypertension, hyperlipidemia, diabetes presents for evaluation of lightheadedness. Heparin  is being started for atrial fibrillation. A review of medical records reveals no chronic anticoagulation prior to arrival.   Date Time HL Rate/Comment 06/14 1805 0.31 Therapeutic x1  Goal of Therapy:  Heparin  level 0.3-0.7 units/ml Monitor platelets by anticoagulation protocol: Yes   Plan:  Continue heparin  infusion at 1000  units/hour Check confirmatory heparin  level in 8 hours Monitor CBC and signs/symptoms of bleeding  Thank you for involving pharmacy in this patient's care.   Ananias Balls, PharmD Clinical Pharmacist 01/24/2024 6:33 PM

## 2024-01-24 NOTE — Consult Note (Signed)
 CARDIOLOGY CONSULT NOTE               Patient ID: Sherri Dawson MRN: [id number] DOB/AGE: 74-Sep-1952 74 y.o.  Admit date: 01/24/2024 Referring Physician Dr. Bunny Caroli ER Primary Physician Dr. Firman Hughes Primary Cardiologist Dr. Bob Burn cardiologist Reason for Consultation atrial fibrillation near-syncope weakness mild hypotension  HPI: 73 year old history of multivessel coronary disease recent underwent coronary bypass surgery x 2 LIMA to LAD SVG to PDA January 14, 2023 at [facility].  Preserved left ventricular function.  Patient had postop A-fib with [facility] but resolved without amiodarone she was discharged on aspirin  and Plavix .  Denies any palpitations tachycardia but was found to have significant atrial fibrillation when she was brought to the emergency room.  Patient has extensive history of peripheral vascular disease diabetes hyperlipidemia and hypertension but currently slightly hypotensive.  Cardiology was consulted because of rapid atrial fibrillation EKG was benign  Review of systems complete and found to be negative unless listed above     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   Hyperlipidemia    Hypertension    PAD (peripheral artery disease) (HCC)     Past Surgical History:  Procedure Laterality Date   CESAREAN SECTION     COLONOSCOPY N/A 12/04/2023   Procedure: COLONOSCOPY;  Surgeon: Quintin Buckle, DO;  Location: [facility] ENDOSCOPY;  Service: Gastroenterology;  Laterality: N/A;   COLONOSCOPY WITH PROPOFOL  N/A 04/06/2018   Procedure: COLONOSCOPY WITH PROPOFOL ;  Surgeon: Cassie Click, MD;  Location: [facility] ENDOSCOPY;  Service: Endoscopy;  Laterality: N/A;   LEFT HEART CATH AND CORONARY ANGIOGRAPHY Left 12/23/2023   Procedure: LEFT HEART CATH AND CORONARY ANGIOGRAPHY;  Surgeon: Antonette Batters, MD;  Location: [facility] INVASIVE CV LAB;  Service:  Cardiovascular;  Laterality: Left;   LOWER EXTREMITY ANGIOGRAPHY Left 02/25/2018   Procedure: LOWER EXTREMITY ANGIOGRAPHY;  Surgeon: Jackquelyn Mass, MD;  Location: [facility] INVASIVE CV LAB;  Service: Cardiovascular;  Laterality: Left;   PERIPHERAL VASCULAR CATHETERIZATION Left 06/19/2016   Procedure: Lower Extremity Angiography;  Surgeon: Jackquelyn Mass, MD;  Location: [facility] INVASIVE CV LAB;  Service: Cardiovascular;  Laterality: Left;    (Not in a hospital admission)  Social History   Socioeconomic History   Marital status: Married    Spouse name: Not on file   Number of children: Not on file   Years of education: Not on file   Highest education level: Not on file  Occupational History   Not on file  Tobacco Use   Smoking status: Former    Current packs/day: 0.00    Average packs/day: 0.5 packs/day for 45.0 years (22.5 ttl pk-yrs)    Types: Cigarettes    Start date: 06/19/1971    Quit date: 06/18/2016    Years since quitting: 7.6   Smokeless tobacco: Never  Vaping Use   Vaping status: Never Used  Substance and Sexual Activity   Alcohol use: Not Currently   Drug use: No   Sexual activity: Not on file  Other Topics Concern   Not on file  Social History Narrative   Not on file   Social Drivers of Health   Financial Resource Strain: Low Risk  (01/13/2024)   Received from [facility]   Overall Financial Resource Strain (CARDIA)    Difficulty of Paying Living Expenses: Not hard at all  Food Insecurity: No  Food Insecurity (01/13/2024)   Received from [facility]   Hunger Vital Sign    Within the past 12 months, you worried that your food would run out before you got the money to buy more.: Never true    Within the past 12 months, the food you bought just didn't last and you didn't have money to get more.: Never true  Transportation Needs: No Transportation Needs (01/14/2024)   Received from [facility] -  Transportation    In the past 12 months, has lack of transportation kept you from medical appointments or from getting medications?: No    Lack of Transportation (Non-Medical): No  Physical Activity: Not on file  Stress: Not on file  Social Connections: Not on file  Intimate Partner Violence: Not on file    Family History  Problem Relation Age of Onset   Heart attack Father    Breast cancer Maternal Aunt       Review of systems complete and found to be negative unless listed above      PHYSICAL EXAM  General: Well developed, well nourished, in no acute distress HEENT:  Normocephalic and atramatic Neck:  No JVD.  Lungs: Clear bilaterally to auscultation and percussion. Heart: Irregular irregular. Normal S1 and S2 without gallops or murmurs.  Abdomen: Bowel sounds are positive, abdomen soft and non-tender  Msk:  Back normal, normal gait. Normal strength and tone for age. Extremities: No clubbing, cyanosis or edema.   Neuro: Alert and oriented X 3. Psych:  Good affect, responds appropriately  Labs:   Lab Results  Component Value Date   WBC 14.9 (H) 01/24/2024   HGB 11.8 (L) 01/24/2024   HCT 37.0 01/24/2024   MCV 88.3 01/24/2024   PLT 503 (H) 01/24/2024    Recent Labs  Lab 01/24/24 0905  NA 135  K 3.5  CL 97*  CO2 25  BUN 15  CREATININE 0.97  CALCIUM 8.3*  PROT 6.3*  BILITOT 1.1  ALKPHOS 48  ALT 15  AST 19  GLUCOSE 185*   No results found for: CKTOTAL, CKMB, CKMBINDEX, TROPONINI No results found for: CHOL No results found for: HDL No results found for: LDLCALC No results found for: TRIG No results found for: CHOLHDL No results found for: LDLDIRECT    Radiology: DG Chest Portable 1 View Result Date: 01/24/2024 CLINICAL DATA:  Tachycardia.  Near syncope. EXAM: PORTABLE CHEST 1 VIEW COMPARISON:  None Available. FINDINGS: The heart size and mediastinal contours are within normal limits. Prior CABG noted. Both lungs are clear. The  visualized skeletal structures are unremarkable. IMPRESSION: No active disease. Electronically Signed   By: Marlyce Sine M.D.   On: 01/24/2024 09:28    EKG: Atrial fibrillation rate of 150 interventricular conduction delay nonspecific ST-T changes  ASSESSMENT AND PLAN:  Atrial fibrillation RVR Near syncope Multivessel coronary artery disease Coronary bypass surgery x 2 Hyperlipidemia Hypertension Diabetes Peripheral vascular disease . Plan Agree with admit to telemetry for atrial fibrillation rapid ventricular response Recommend switch to IV amiodarone loading drip for rhythm control Recommend IV magnesium 2 to 4 mg IV Relative hypotension telemetry options for rate control We will see how she does on amiodarone consider whether there is any improvement with magnesium and then consider whether digoxin will be helpful temporarily Discontinue Imdur no recent angina Consider echocardiogram for evaluation post bypass surgery rule out pericardial effusion or pericarditis with rapid atrial fibrillation Continue adequate hydration for renal insufficiency Will  consider TEE cardioversion if the patient does not convert within 24 to 48 hours We will transition to Eliquis p.o. for at least 90 days   Signed: Antonette Batters MD 01/24/2024, 10:16 AM

## 2024-01-24 NOTE — H&P (Signed)
 History and Physical    Sherri Dawson WJX:[id number] DOB: 1951/04/30 DOA: 01/24/2024  PCP: Sari Cunning, MD (Confirm with patient/family/NH records and if not entered, this has to be entered at [facility] point of entry) Patient coming from: Home  I have personally briefly reviewed patient's old medical records in [facility] Link  Chief Complaint: Palpitations, lightheadedness  HPI: Sherri Dawson is a 73 y.o. female with medical history significant of multivessel CAD status post CABG recently, HTN with chronic HFpEF grade 1 diastolic dysfunction, HLD, PVD, presented with palpitations and near syncope.  Patient underwent CABG 1 week ago at [facility].  She tolerated well and went home.  This morning, patient started to have palpitation and lightheadedness, denied any chest pain no cough no fever or chills.  No fall no LOC.  ED Course: Afebrile, heart rate 140s, blood pressure 98/69.  EKG showed new onset of A-fib.  Potassium 3.5 BUN 15 creatinine 0.9 WBC 14.  Patient was started on amiodarone drip and heparin  drip.  Review of Systems: As per HPI otherwise 14 point review of systems negative.    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   Hyperlipidemia    Hypertension    PAD (peripheral artery disease) [facility])     Past Surgical History:  Procedure Laterality Date   CESAREAN SECTION     COLONOSCOPY N/A 12/04/2023   Procedure: COLONOSCOPY;  Surgeon: Quintin Buckle, DO;  Location: [facility] ENDOSCOPY;  Service: Gastroenterology;  Laterality: N/A;   COLONOSCOPY WITH PROPOFOL  N/A 04/06/2018   Procedure: COLONOSCOPY WITH PROPOFOL ;  Surgeon: Cassie Click, MD;  Location: [facility] ENDOSCOPY;  Service: Endoscopy;  Laterality: N/A;   LEFT HEART CATH AND CORONARY ANGIOGRAPHY Left 12/23/2023   Procedure: LEFT HEART CATH AND CORONARY ANGIOGRAPHY;  Surgeon: Antonette Batters, MD;   Location: [facility] INVASIVE CV LAB;  Service: Cardiovascular;  Laterality: Left;   LOWER EXTREMITY ANGIOGRAPHY Left 02/25/2018   Procedure: LOWER EXTREMITY ANGIOGRAPHY;  Surgeon: Jackquelyn Mass, MD;  Location: [facility] INVASIVE CV LAB;  Service: Cardiovascular;  Laterality: Left;   PERIPHERAL VASCULAR CATHETERIZATION Left 06/19/2016   Procedure: Lower Extremity Angiography;  Surgeon: Jackquelyn Mass, MD;  Location: [facility] INVASIVE CV LAB;  Service: Cardiovascular;  Laterality: Left;     reports that she quit smoking about 7 years ago. Her smoking use included cigarettes. She started smoking about 52 years ago. She has a 22.5 pack-year smoking history. She has never used smokeless tobacco. She reports that she does not currently use alcohol. She reports that she does not use drugs.  No Known Allergies  Family History  Problem Relation Age of Onset   Heart attack Father    Breast cancer Maternal Aunt      Prior to Admission medications   Medication Sig Start Date End Date Taking? Authorizing Provider  aspirin  81 MG chewable tablet Chew 81 mg by mouth daily.    [provider]  clopidogrel  (PLAVIX ) 75 MG tablet TAKE 1 TABLET BY MOUTH DAILY 06/22/20   [name], Gregory G, MD  cyanocobalamin (VITAMIN B12) 1000 MCG tablet Take 2,000 mcg by mouth daily.    [provider]  isosorbide mononitrate (IMDUR) 30 MG 24 hr tablet Take 30 mg by mouth. 12/10/23 12/09/24  [provider]  levothyroxine (SYNTHROID) 50 MCG tablet Take 50 mcg by mouth daily before breakfast.  [provider]  metFORMIN (GLUCOPHAGE) 500 MG tablet Take 500 mg by mouth daily with breakfast.    [provider]  olmesartan-hydrochlorothiazide (BENICAR HCT) 20-12.5 MG tablet Take 1 tablet by mouth daily.     [provider]  rosuvastatin (CRESTOR) 10 MG tablet Take 10 mg by mouth daily. 10/04/21   [provider]    Physical Exam: Vitals:   01/24/24 0856 01/24/24 0930  01/24/24 1045 01/24/24 1130  BP: 98/69 114/70 96/60 98/66   Pulse: (!) 146 (!) 138 (!) 41 (!) 104  Resp: 18 (!) 21 18 (!) 23  Temp: 98 F (36.7 C)     SpO2: 100% 94% 95% 100%  Weight:      Height:        Constitutional: NAD, calm, comfortable Vitals:   01/24/24 0856 01/24/24 0930 01/24/24 1045 01/24/24 1130  BP: 98/69 114/70 96/60 98/66   Pulse: (!) 146 (!) 138 (!) 41 (!) 104  Resp: 18 (!) 21 18 (!) 23  Temp: 98 F (36.7 C)     SpO2: 100% 94% 95% 100%  Weight:      Height:       Eyes: PERRL, lids and conjunctivae normal ENMT: Mucous membranes are moist. Posterior pharynx clear of any exudate or lesions.Normal dentition.  Neck: normal, supple, no masses, no thyromegaly Respiratory: clear to auscultation bilaterally, no wheezing, no crackles. Normal respiratory effort. No accessory muscle use.  Cardiovascular: Irregular heartbeat, no murmurs / rubs / gallops. No extremity edema. 2+ pedal pulses. No carotid bruits.  Abdomen: no tenderness, no masses palpated. No hepatosplenomegaly. Bowel sounds positive.  Musculoskeletal: no clubbing / cyanosis. No joint deformity upper and lower extremities. Good ROM, no contractures. Normal muscle tone.  Skin: no rashes, lesions, ulcers. No induration Neurologic: CN 2-12 grossly intact. Sensation intact, DTR normal. Strength 5/5 in all 4.  Psychiatric: Normal judgment and insight. Alert and oriented x 3. Normal mood.     Labs on Admission: I have personally reviewed following labs and imaging studies  CBC: Recent Labs  Lab 01/24/24 0905  WBC 14.9*  NEUTROABS 10.7*  HGB 11.8*  HCT 37.0  MCV 88.3  PLT 503*   Basic Metabolic Panel: Recent Labs  Lab 01/24/24 0905  NA 135  K 3.5  CL 97*  CO2 25  GLUCOSE 185*  BUN 15  CREATININE 0.97  CALCIUM 8.3*   GFR: Estimated Creatinine Clearance: 44.6 mL/min (by C-G formula based on SCr of 0.97 mg/dL). Liver Function Tests: Recent Labs  Lab 01/24/24 0905  AST 19  ALT 15  ALKPHOS 48   BILITOT 1.1  PROT 6.3*  ALBUMIN 2.9*   No results for input(s): LIPASE, AMYLASE in the last 168 hours. No results for input(s): AMMONIA in the last 168 hours. Coagulation Profile: Recent Labs  Lab 01/24/24 0905  INR 1.1   Cardiac Enzymes: No results for input(s): CKTOTAL, CKMB, CKMBINDEX, TROPONINI in the last 168 hours. BNP (last 3 results) No results for input(s): PROBNP in the last 8760 hours. HbA1C: No results for input(s): HGBA1C in the last 72 hours. CBG: No results for input(s): GLUCAP in the last 168 hours. Lipid Profile: No results for input(s): CHOL, HDL, LDLCALC, TRIG, CHOLHDL, LDLDIRECT in the last 72 hours. Thyroid  Function Tests: No results for input(s): TSH, T4TOTAL, FREET4, T3FREE, THYROIDAB in the last 72 hours. Anemia Panel: No results for input(s): VITAMINB12, FOLATE, FERRITIN, TIBC, IRON, RETICCTPCT in the last 72 hours. Urine analysis: No results found for: COLORURINE, APPEARANCEUR, LABSPEC, PHURINE,  GLUCOSEU, HGBUR, BILIRUBINUR, KETONESUR, PROTEINUR, UROBILINOGEN, NITRITE, LEUKOCYTESUR  Radiological Exams on Admission: DG Chest Portable 1 View Result Date: 01/24/2024 CLINICAL DATA:  Tachycardia.  Near syncope. EXAM: PORTABLE CHEST 1 VIEW COMPARISON:  None Available. FINDINGS: The heart size and mediastinal contours are within normal limits. Prior CABG noted. Both lungs are clear. The visualized skeletal structures are unremarkable. IMPRESSION: No active disease. Electronically Signed   By: Marlyce Sine M.D.   On: 01/24/2024 09:28    EKG: Independently reviewed.  A-fib with RVR, no acute ST changes.  Assessment/Plan Principal Problem:   Afib (HCC) Active Problems:   A-fib (HCC)  (please populate well all problems here in Problem List. (For example, if patient is on BP meds at home and you resume or decide to hold them, it is a problem that needs to be her. Same for CAD, COPD,  HLD and so on)   Near syncope New onset of A-fib with RVR - Cardiology consulted, patient on amiodarone drip -CHADS2=2, patient on heparin  drip. - Check TSH, Mg and phos level.  1 dose of p.o. KCl given to make K> 4.0  Hypertension - BP borderline low, hold off home dose of ARB  CAD with recent CABG - No acute concern, continue aspirin  and Plavix .  No history of GI bleed.  Outpatient follow-up with cardiology/PCP to decide whether to to stay on dual antiplatelet while on systemic anticoagulation.  IIDM - Continue metformin  Hypothyroidism - Continue Synthroid  DVT prophylaxis: Heparin  drip Code Status: Full code Family Communication: None at bedside Disposition Plan: Expect less than 2 midnight hospital stay Consults called: Cardiology Admission status: PCU observation   [place] MD [facility] Pager [phone]  01/24/2024, 12:09 PM

## 2024-01-25 DIAGNOSIS — I2510 Atherosclerotic heart disease of native coronary artery without angina pectoris: Secondary | ICD-10-CM

## 2024-01-25 DIAGNOSIS — I4891 Unspecified atrial fibrillation: Principal | ICD-10-CM

## 2024-01-25 LAB — CBC
HCT: 29.8 % — ABNORMAL LOW (ref 36.0–46.0)
Hemoglobin: 9.7 g/dL — ABNORMAL LOW (ref 12.0–15.0)
MCH: 28.4 pg (ref 26.0–34.0)
MCHC: 32.6 g/dL (ref 30.0–36.0)
MCV: 87.4 fL (ref 80.0–100.0)
Platelets: 443 10*3/uL — ABNORMAL HIGH (ref 150–400)
RBC: 3.41 MIL/uL — ABNORMAL LOW (ref 3.87–5.11)
RDW: 13.5 % (ref 11.5–15.5)
WBC: 10.9 10*3/uL — ABNORMAL HIGH (ref 4.0–10.5)
nRBC: 0 % (ref 0.0–0.2)

## 2024-01-25 LAB — BASIC METABOLIC PANEL WITH GFR
Anion gap: 8 (ref 5–15)
BUN: 15 mg/dL (ref 8–23)
CO2: 26 mmol/L (ref 22–32)
Calcium: 7.9 mg/dL — ABNORMAL LOW (ref 8.9–10.3)
Chloride: 101 mmol/L (ref 98–111)
Creatinine, Ser: 0.83 mg/dL (ref 0.44–1.00)
GFR, Estimated: >60 mL/min (ref >60)
Glucose, Bld: 157 mg/dL — ABNORMAL HIGH (ref 70–99)
Potassium: 3.8 mmol/L (ref 3.5–5.1)
Sodium: 135 mmol/L (ref 135–145)

## 2024-01-25 LAB — HEPARIN LEVEL (UNFRACTIONATED): Heparin Unfractionated: 0.32 [IU]/mL (ref 0.30–0.70)

## 2024-01-25 MED ORDER — AMIODARONE HCL 200 MG PO TABS
ORAL_TABLET | ORAL | 1 refills | Status: AC
Start: 2024-01-25 — End: 2024-03-16

## 2024-01-25 MED ORDER — METOPROLOL SUCCINATE ER 25 MG PO TB24: 12.5000 mg | ORAL_TABLET | Freq: Every day | ORAL | Status: DC

## 2024-01-25 MED ORDER — APIXABAN 5 MG PO TABS
5.0000 mg | ORAL_TABLET | Freq: Two times a day (BID) | ORAL | Status: DC
  Administered 2024-01-25: 5 mg via ORAL
  Filled 2024-01-25: qty 1

## 2024-01-25 MED ORDER — AMIODARONE HCL 200 MG PO TABS: 200.0000 mg | ORAL_TABLET | Freq: Every day | ORAL | Status: DC

## 2024-01-25 MED ORDER — ROSUVASTATIN CALCIUM 10 MG PO TABS: 20.0000 mg | ORAL_TABLET | Freq: Every day | ORAL | Status: DC

## 2024-01-25 MED ORDER — AMIODARONE HCL 200 MG PO TABS: 200.0000 mg | ORAL_TABLET | Freq: Two times a day (BID) | ORAL | Status: DC

## 2024-01-25 MED ORDER — APIXABAN 5 MG PO TABS: 5.0000 mg | ORAL_TABLET | Freq: Two times a day (BID) | ORAL | 2 refills | Status: AC

## 2024-01-25 MED ORDER — AMIODARONE HCL 200 MG PO TABS
200.0000 mg | ORAL_TABLET | Freq: Two times a day (BID) | ORAL | Status: DC
  Administered 2024-01-25: 200 mg via ORAL
  Filled 2024-01-25: qty 1

## 2024-01-25 MED ORDER — METOPROLOL SUCCINATE ER 25 MG PO TB24
12.5000 mg | ORAL_TABLET | Freq: Every day | ORAL | Status: DC
  Administered 2024-01-25: 12.5 mg via ORAL
  Filled 2024-01-25: qty 1

## 2024-01-25 NOTE — Care Management CC44 (Signed)
 Condition Code 44 Documentation Completed  Patient Details  Name: Sherri Dawson MRN: [id number] Date of Birth: December 08, 1950   Condition Code 44 given:    Patient signature on Condition Code 44 notice:    Documentation of 2 MD's agreement:    Code 44 added to claim:       Zoe Hinds, RN 01/25/2024, 3:29 PM

## 2024-01-25 NOTE — Plan of Care (Signed)
 Patient AO X4. No pain. Discharge order in, PIVx2 removed. VSS. Tele box removed. Resting in bed. Spouse at bedside. AVS given no questions at this time, ready for transport.   Problem: Education: Goal: Knowledge of General Education information will improve Description: Including pain rating scale, medication(s)/side effects and non-pharmacologic comfort measures Outcome: Progressing   Problem: Health Behavior/Discharge Planning: Goal: Ability to manage health-related needs will improve Outcome: Progressing

## 2024-01-25 NOTE — Progress Notes (Signed)
 [facility] Cardiology    SUBJECTIVE: Patient states she feels reasonably well much improved no palpitation tachycardia heart rate reasonably managed feels well enough to go home   Vitals:   01/24/24 1941 01/25/24 0022 01/25/24 0420 01/25/24 0718  BP: 100/66 105/67 122/77 120/77  Pulse: 94 89 88 92  Resp: 18 20 19 17   Temp: 98 F (36.7 C) (!) 97.5 F (36.4 C) (!) 97.2 F (36.2 C) (!) 97.5 F (36.4 C)  TempSrc:   Oral   SpO2: 97% 98% 98% 96%  Weight:      Height:         Intake/Output Summary (Last 24 hours) at 01/25/2024 0730 Last data filed at 01/24/2024 1751 Gross per 24 hour  Intake 449.76 ml  Output --  Net 449.76 ml      PHYSICAL EXAM  General: Well developed, well nourished, in no acute distress HEENT:  Normocephalic and atramatic Neck:  No JVD.  Lungs: Clear bilaterally to auscultation and percussion. Heart: HRRR . Normal S1 and S2 without gallops or murmurs.  Abdomen: Bowel sounds are positive, abdomen soft and non-tender  Msk:  Back normal, normal gait. Normal strength and tone for age. Extremities: No clubbing, cyanosis or edema.   Neuro: Alert and oriented X 3. Psych:  Good affect, responds appropriately   LABS: Basic Metabolic Panel: Recent Labs    01/24/24 0905 01/24/24 1052 01/25/24 0213  NA 135  --  135  K 3.5  --  3.8  CL 97*  --  101  CO2 25  --  26  GLUCOSE 185*  --  157*  BUN 15  --  15  CREATININE 0.97  --  0.83  CALCIUM 8.3*  --  7.9*  MG  --  1.9  --   PHOS  --  2.7  --    Liver Function Tests: Recent Labs    01/24/24 0905  AST 19  ALT 15  ALKPHOS 48  BILITOT 1.1  PROT 6.3*  ALBUMIN 2.9*   No results for input(s): LIPASE, AMYLASE in the last 72 hours. CBC: Recent Labs    01/24/24 0905 01/25/24 0213  WBC 14.9* 10.9*  NEUTROABS 10.7*  --   HGB 11.8* 9.7*  HCT 37.0 29.8*  MCV 88.3 87.4  PLT 503* 443*   Cardiac Enzymes: No results for input(s): CKTOTAL, CKMB, CKMBINDEX, TROPONINI in the last 72  hours. BNP: Invalid input(s): POCBNP D-Dimer: No results for input(s): DDIMER in the last 72 hours. Hemoglobin A1C: No results for input(s): HGBA1C in the last 72 hours. Fasting Lipid Panel: No results for input(s): CHOL, HDL, LDLCALC, TRIG, CHOLHDL, LDLDIRECT in the last 72 hours. Thyroid  Function Tests: Recent Labs    01/24/24 1052  TSH 3.770   Anemia Panel: No results for input(s): VITAMINB12, FOLATE, FERRITIN, TIBC, IRON, RETICCTPCT in the last 72 hours.  DG Chest Portable 1 View Result Date: 01/24/2024 CLINICAL DATA:  Tachycardia.  Near syncope. EXAM: PORTABLE CHEST 1 VIEW COMPARISON:  None Available. FINDINGS: The heart size and mediastinal contours are within normal limits. Prior CABG noted. Both lungs are clear. The visualized skeletal structures are unremarkable. IMPRESSION: No active disease. Electronically Signed   By: Marlyce Sine M.D.   On: 01/24/2024 09:28     Echo pending  TELEMETRY: Normal sinus rhythm rate of 80:  ASSESSMENT AND PLAN:  Principal Problem:   Afib (HCC) Active Problems:   A-fib (HCC) History status post coronary bypass surgery Hyperlipidemia Peripheral vascular disease Near syncope Palpitations  Multivessel coronary disease  Plan Patient appears to be doing reasonably well converted to sinus rhythm we will transition to p.o. meds and hopefully arrange for discharge and follow-up as an outpatient Discontinue IV heparin  and transition to Eliquis 5 mg twice a day Transition from IV amiodarone to p.o. 200 mg twice a day for 1 week then 200 mg a day Continue metoprolol  for heart rate management and control Discontinue aspirin  while patient is on Eliquis Continue Plavix  for dual anticoagulation with Eliquis Discontinue Imdur for anginal type symptoms Continue diabetes management to control with metformin Maintain hyperlipidemia therapy with Crestor Okay to continue Benicar HCTZ pretension diabetes renal  protection Patient follow-up with cardiology 1 to 2 weeks  Cardiac meds for discharge Amiodarone 200 mg twice a day for 1 week then 200 mg a day Metoprolol  succinate 12.5 mg daily Eliquis 5 mg twice a day Crestor 20 mg daily Benicar 20/12.5 daily Plavix  75 mg daily x 12 months  Discontinue meds Discontinue aspirin  81 mg a day Discontinue heparin  Discontinue IV amiodarone   Have the patient follow-up with cardiology 1 to 2 weeks Recommend cardiac rehab post coronary bypass surgery   Antonette Batters, MD 01/25/2024 7:30 AM

## 2024-01-25 NOTE — Plan of Care (Signed)
  Problem: Education: Goal: Knowledge of General Education information will improve Description: Including pain rating scale, medication(s)/side effects and non-pharmacologic comfort measures 01/25/2024 0321 by Rhodia Cera, RN Outcome: Progressing

## 2024-01-25 NOTE — Progress Notes (Signed)
 PHARMACY - ANTICOAGULATION CONSULT NOTE  Pharmacy Consult for heparin  infusion Indication: atrial fibrillation  No Known Allergies  Patient Measurements: Height: 5' 4 (162.6 cm) Weight: 61.7 kg (136 lb) IBW/kg (Calculated) : 54.7 HEPARIN  DW (KG): 61.7  Vital Signs: Temp: 97.5 F (36.4 C) (06/15 0022) Temp Source: Oral (06/14 1718) BP: 105/67 (06/15 0022) Pulse Rate: 89 (06/15 0022)  Labs: Recent Labs    01/24/24 0905 01/24/24 1052 01/24/24 1805 01/25/24 0213  HGB 11.8*  --   --  9.7*  HCT 37.0  --   --  29.8*  PLT 503*  --   --  443*  APTT 26  --   --   --   LABPROT 14.3  --   --   --   INR 1.1  --   --   --   HEPARINUNFRC  --   --  0.31 0.32  CREATININE 0.97  --   --  0.83  TROPONINIHS 23* 23*  --   --     Estimated Creatinine Clearance: 52.1 mL/min (by C-G formula based on SCr of 0.83 mg/dL).   Medical History: 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   Hyperlipidemia    Hypertension    PAD (peripheral artery disease) (HCC)     Medications:  Scheduled:   aspirin   81 mg Oral Daily   clopidogrel   75 mg Oral Daily   isosorbide mononitrate  30 mg Oral Daily   levothyroxine  50 mcg Oral QAC breakfast   metFORMIN  500 mg Oral Q breakfast   rosuvastatin  10 mg Oral Daily   Infusions:   amiodarone 30 mg/hr (01/24/24 2322)   heparin  1,000 Units/hr (01/24/24 1751)    Assessment: 73 y.o. female PMH prior recent CABG, hypertension, hyperlipidemia, diabetes presents for evaluation of lightheadedness. Heparin  is being started for atrial fibrillation. A review of medical records reveals no chronic anticoagulation prior to arrival.   Date Time HL Rate/Comment 06/14 1805 0.31 Therapeutic x1 06/15 0213 0.32 Therapeutic x 2  Goal of Therapy:  Heparin  level 0.3-0.7 units/ml Monitor platelets by anticoagulation protocol: Yes   Plan:  Continue heparin   infusion at 1000 units/hour Recheck HL at 1600 then daily if still therapeutic Monitor CBC and signs/symptoms of bleeding  Thank you for involving pharmacy in this patient's care.   Coretta Dexter, PharmD, [facility] 01/25/2024 3:05 AM

## 2024-01-25 NOTE — Discharge Summary (Addendum)
 Physician Discharge Summary   Patient: Sherri Dawson MRN: [id number] DOB: 02/01/1951  Admit date:     01/24/2024  Discharge date: 01/25/24  Discharge Physician: Melvinia Stager   PCP: Sari Cunning, MD   Recommendations at discharge:   follow-up cardiology Dr. Bob Burn in one week. Follow-up PCP in 1 to 2 weeks  Discharge Diagnoses: Principal Problem:   Afib (HCC) Active Problems:   A-fib (HCC)  Sherri Dawson is a 73 y.o. female with medical history significant of multivessel CAD status post CABG recently, HTN with chronic HFpEF grade 1 diastolic dysfunction, HLD, PVD, presented with palpitations and near syncope.   Patient underwent CABG 1 week ago at [facility].  She tolerated well and went home.  This morning, patient started to have palpitation and lightheadedness, denied any chest pain no cough no fever or chills.  No fall no LOC.  Patient was found to have atrial fibrillation with RVR heartrate was 116-130  Near syncope New onset of A-fib with RVR status post recent CABG June 4 at [facility] cardiology consulted, patient on amiodarone drip-- patient is heartrate is in the 90s which is her baseline. She is a feeling better. Okay to change to oral amiodarone 200 mg BID for one week and then daily. -- Per Dr. Beau Bound start patient on eliquis five BID and continue Plavix . --Discontinue aspirin . -- Electrolytes and TSH stable -- patient improved clinically and hemodynamically stable. -- okay to go home per cardiology. Patient is eager to go home. She'll follow-up with her [facility] cardiologist and [facility] cardiothoracic surgery on her appointment   Hypertension - BP borderline low, hold off home dose of ARB (d/ced at [facility] per records)   CAD with recent CABG in June 2025 - No acute concern, continue  Plavix .  No history of GI bleed.   --no CP --d/c asa since pt on eliquis   DM--2 - Continue metformin   Hypothyroidism - Continue Synthroid  Overall hemodynamically stable. Discharge  plan discussed with patient's. She is in agreement. Discussed with husband at bedside     Consultants: [facility] cardiology Procedures performed: none  Disposition: Home Diet recommendation:  Discharge Diet Orders (From admission, onward)     Start     Ordered   01/25/24 0000  Diet - low sodium heart healthy        01/25/24 1434           Cardiac and Carb modified diet DISCHARGE MEDICATION: Allergies as of 01/25/2024   No Known Allergies      Medication List     PAUSE taking these medications    olmesartan-hydrochlorothiazide 20-12.5 MG tablet Wait to take this until your doctor or other care provider tells you to start again. Commonly known as: BENICAR HCT Take 1 tablet by mouth daily.       STOP taking these medications    aspirin  81 MG chewable tablet   isosorbide mononitrate 30 MG 24 hr tablet Commonly known as: IMDUR       TAKE these medications    acetaminophen  325 MG tablet Commonly known as: TYLENOL  Take 975 mg by mouth every 6 (six) hours as needed for moderate pain (pain score 4-6).   amiodarone 200 MG tablet Commonly known as: PACERONE Take 1 tablet (200 mg total) by mouth 2 (two) times daily for 7 days, THEN 1 tablet (200 mg total) daily. Start taking on: January 25, 2024 What changed: See the new instructions.   apixaban 5 MG Tabs tablet Commonly  known as: ELIQUIS Take 1 tablet (5 mg total) by mouth 2 (two) times daily.   clopidogrel  75 MG tablet Commonly known as: PLAVIX  TAKE 1 TABLET BY MOUTH DAILY   cyanocobalamin 1000 MCG tablet Commonly known as: VITAMIN B12 Take 2,000 mcg by mouth daily.   furosemide 40 MG tablet Commonly known as: LASIX Take 40 mg by mouth daily.   levothyroxine 50 MCG tablet Commonly known as: SYNTHROID Take 50 mcg by mouth daily before breakfast.   metFORMIN 500 MG tablet Commonly known as: GLUCOPHAGE Take 500 mg by mouth daily with breakfast.   metoprolol  tartrate 25 MG tablet Commonly known as:  LOPRESSOR  Take 12.5 mg by mouth 2 (two) times daily.   oxyCODONE  5 MG immediate release tablet Commonly known as: Oxy IR/ROXICODONE  Take 5 mg by mouth every 8 (eight) hours as needed for moderate pain (pain score 4-6).   polyethylene glycol 17 g packet Commonly known as: MIRALAX / GLYCOLAX Take 17 g by mouth daily as needed for moderate constipation.   potassium chloride SA 20 MEQ tablet Commonly known as: KLOR-CON M Take 40 mEq by mouth daily.   rosuvastatin 10 MG tablet Commonly known as: CRESTOR Take 10 mg by mouth daily.   senna-docusate 8.6-50 MG tablet Commonly known as: Senokot-S Take 2 tablets by mouth 2 (two) times daily as needed for moderate constipation.        Follow-up Information     Sari Cunning, MD. Schedule an appointment as soon as possible for a visit in 1 week(s).   Specialty: Internal Medicine Contact information: [phone] [facility] [address] [facility] [address] [place] [address] [phone]         Janette Medley, MD. Schedule an appointment as soon as possible for a visit in 1 week(s).   Specialty: Cardiology Why: Post CABG Afib Contact information: [address] [place] [address] [phone]                Discharge Exam: Cleavon Curls Weights   01/24/24 0853  Weight: 61.7 kg  alert and oriented times three respiratory clear to auscultation cardiovascular both heart sounds normal. No murmur. Appears regular rhythm neuro- grossly intact   Condition at discharge: fair  The results of significant diagnostics from this hospitalization (including imaging, microbiology, ancillary and laboratory) are listed below for reference.   Imaging Studies: DG Chest Portable 1 View Result Date: 01/24/2024 CLINICAL DATA:  Tachycardia.  Near syncope. EXAM: PORTABLE CHEST 1 VIEW COMPARISON:  None Available. FINDINGS: The heart size and mediastinal contours are within normal limits. Prior CABG noted. Both lungs are clear.  The visualized skeletal structures are unremarkable. IMPRESSION: No active disease. Electronically Signed   By: Marlyce Sine M.D.   On: 01/24/2024 09:28    Microbiology: No results found for this or any previous visit.  Labs: CBC: Recent Labs  Lab 01/24/24 0905 01/25/24 0213  WBC 14.9* 10.9*  NEUTROABS 10.7*  --   HGB 11.8* 9.7*  HCT 37.0 29.8*  MCV 88.3 87.4  PLT 503* 443*   Basic Metabolic Panel: Recent Labs  Lab 01/24/24 0905 01/24/24 1052 01/25/24 0213  NA 135  --  135  K 3.5  --  3.8  CL 97*  --  101  CO2 25  --  26  GLUCOSE 185*  --  157*  BUN 15  --  15  CREATININE 0.97  --  0.83  CALCIUM 8.3*  --  7.9*  MG  --  1.9  --  PHOS  --  2.7  --    Liver Function Tests: Recent Labs  Lab 01/24/24 0905  AST 19  ALT 15  ALKPHOS 48  BILITOT 1.1  PROT 6.3*  ALBUMIN 2.9*    Discharge time spent: greater than 30 minutes.  Signed: Melvinia Stager, MD [facility] 01/25/2024

## 2024-02-19 ENCOUNTER — Encounter (INDEPENDENT_AMBULATORY_CARE_PROVIDER_SITE_OTHER): Payer: Medicare HMO

## 2024-02-19 ENCOUNTER — Ambulatory Visit (INDEPENDENT_AMBULATORY_CARE_PROVIDER_SITE_OTHER): Payer: Medicare HMO | Admitting: Vascular Surgery

## 2024-02-20 ENCOUNTER — Other Ambulatory Visit (INDEPENDENT_AMBULATORY_CARE_PROVIDER_SITE_OTHER): Payer: Self-pay | Admitting: Vascular Surgery

## 2024-02-20 DIAGNOSIS — I70213 Atherosclerosis of native arteries of extremities with intermittent claudication, bilateral legs: Secondary | ICD-10-CM

## 2024-02-23 ENCOUNTER — Encounter (INDEPENDENT_AMBULATORY_CARE_PROVIDER_SITE_OTHER): Payer: Self-pay | Admitting: Vascular Surgery

## 2024-02-23 ENCOUNTER — Ambulatory Visit (INDEPENDENT_AMBULATORY_CARE_PROVIDER_SITE_OTHER)

## 2024-02-23 ENCOUNTER — Ambulatory Visit (INDEPENDENT_AMBULATORY_CARE_PROVIDER_SITE_OTHER): Admitting: Vascular Surgery

## 2024-02-23 VITALS — BP 131/83 | HR 82 | Resp 18 | Ht 64.0 in | Wt 137.0 lb

## 2024-02-23 DIAGNOSIS — I6523 Occlusion and stenosis of bilateral carotid arteries: Secondary | ICD-10-CM

## 2024-02-23 DIAGNOSIS — I4891 Unspecified atrial fibrillation: Secondary | ICD-10-CM

## 2024-02-23 DIAGNOSIS — I70213 Atherosclerosis of native arteries of extremities with intermittent claudication, bilateral legs: Secondary | ICD-10-CM

## 2024-02-23 DIAGNOSIS — I2510 Atherosclerotic heart disease of native coronary artery without angina pectoris: Secondary | ICD-10-CM | POA: Diagnosis not present

## 2024-02-23 DIAGNOSIS — E782 Mixed hyperlipidemia: Secondary | ICD-10-CM

## 2024-02-25 LAB — VAS US ABI WITH/WO TBI
Left ABI: 1.04
Right ABI: 1.18

## 2024-02-28 ENCOUNTER — Encounter (INDEPENDENT_AMBULATORY_CARE_PROVIDER_SITE_OTHER): Payer: Self-pay | Admitting: Vascular Surgery

## 2024-02-28 NOTE — Progress Notes (Signed)
 MRN : [id number]  Sherri Dawson is a 73 y.o. (08-23-1950) female who presents with chief complaint of check circulation.  History of Present Illness:  The patient returns to the office for followup and review of the noninvasive studies. There have been no interval changes in lower extremity symptoms. No interval shortening of the patient's claudication distance or development of rest pain symptoms. No new ulcers or wounds have occurred since the last visit.   Angiogram with intervention on 02/25/2018: 1.  Crosser atherectomy left SFA and popliteal            2.  Percutaneous transluminal angioplasty and stent placement superficial femoral and popliteal arteries to 6 to 7 mm with life stents and [employer] drug-eluting balloons 3.  Percutaneous transluminal angioplasty the left posterior tibial artery to 2.5 mm with an Ultraverse balloon   There have been no significant changes to the patient's overall health care.   The patient denies amaurosis fugax or recent TIA symptoms. There are no recent neurological changes noted. The patient denies history of DVT, PE or superficial thrombophlebitis. The patient denies recent episodes of angina or shortness of breath.    ABI's Rt=1.18 and Lt=1.04 (previous ABI's Rt=1.18 and Lt=1.11).   Previous duplex ultrasound of the left lower extremity shows widely patent arterial system with biphasic signals through out and a widely patent stent   Previous carotid duplex ultrasound dated 07/03/2016 demonstrated 1 to 39% diameter reduction bilateral internal carotid arteries  Current Meds  Medication Sig   amiodarone  (PACERONE ) 200 MG tablet Take 1 tablet (200 mg total) by mouth 2 (two) times daily for 7 days, THEN 1 tablet (200 mg total) daily.   apixaban  (ELIQUIS ) 5 MG TABS tablet Take 1 tablet (5 mg total) by mouth 2 (two) times daily.   clopidogrel  (PLAVIX ) 75 MG tablet TAKE 1  TABLET BY MOUTH DAILY   cyanocobalamin (VITAMIN B12) 1000 MCG tablet Take 2,000 mcg by mouth daily.   furosemide (LASIX) 40 MG tablet Take 40 mg by mouth daily.   levothyroxine  (SYNTHROID ) 50 MCG tablet Take 50 mcg by mouth daily before breakfast.   metFORMIN  (GLUCOPHAGE ) 500 MG tablet Take 500 mg by mouth daily with breakfast.   metoprolol  tartrate (LOPRESSOR ) 25 MG tablet Take 12.5 mg by mouth 2 (two) times daily.   polyethylene glycol (MIRALAX / GLYCOLAX) 17 g packet Take 17 g by mouth daily as needed for moderate constipation.   rosuvastatin  (CRESTOR ) 10 MG tablet Take 10 mg by mouth daily.   senna-docusate (SENOKOT-S) 8.6-50 MG tablet Take 2 tablets by mouth 2 (two) times daily as needed for moderate constipation.    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   Hyperlipidemia    Hypertension    PAD (peripheral artery disease) [facility])     Past Surgical History:  Procedure Laterality Date   CESAREAN SECTION     COLONOSCOPY N/A 12/04/2023   Procedure: COLONOSCOPY;  Surgeon: Onita Elspeth Sharper, DO;  Location:  [facility] ENDOSCOPY;  Service: Gastroenterology;  Laterality: N/A;   COLONOSCOPY WITH PROPOFOL  N/A 04/06/2018   Procedure: COLONOSCOPY WITH PROPOFOL ;  Surgeon: Viktoria Lamar DASEN, MD;  Location: [facility] ENDOSCOPY;  Service: Endoscopy;  Laterality: N/A;   LEFT HEART CATH AND CORONARY ANGIOGRAPHY Left 12/23/2023   Procedure: LEFT HEART CATH AND CORONARY ANGIOGRAPHY;  Surgeon: Florencio Cara BIRCH, MD;  Location: [facility] INVASIVE CV LAB;  Service: Cardiovascular;  Laterality: Left;   LOWER EXTREMITY ANGIOGRAPHY Left 02/25/2018   Procedure: LOWER EXTREMITY ANGIOGRAPHY;  Surgeon: Jama Cordella MATSU, MD;  Location: [facility] INVASIVE CV LAB;  Service: Cardiovascular;  Laterality: Left;   PERIPHERAL VASCULAR CATHETERIZATION Left 06/19/2016   Procedure: Lower Extremity Angiography;  Surgeon: Cordella MATSU Jama, MD;  Location: [facility] INVASIVE CV LAB;  Service: Cardiovascular;  Laterality: Left;    Social History Social History   Tobacco Use   Smoking status: Former    Current packs/day: 0.00    Average packs/day: 0.5 packs/day for 45.0 years (22.5 ttl pk-yrs)    Types: Cigarettes    Start date: 06/19/1971    Quit date: 06/18/2016    Years since quitting: 7.7   Smokeless tobacco: Never  Vaping Use   Vaping status: Never Used  Substance Use Topics   Alcohol use: Not Currently   Drug use: No    Family History Family History  Problem Relation Age of Onset   Heart attack Father    Breast cancer Maternal Aunt     No Known Allergies   REVIEW OF SYSTEMS (Negative unless checked)  Constitutional: [] Weight loss  [] Fever  [] Chills Cardiac: [] Chest pain   [] Chest pressure   [] Palpitations   [] Shortness of breath when laying flat   [] Shortness of breath with exertion. Vascular:  [x] Pain in legs with walking   [] Pain in legs at rest  [] History of DVT   [] Phlebitis   [] Swelling in legs   [] Varicose veins   [] Non-healing ulcers Pulmonary:   [] Uses home oxygen   [] Productive cough   [] Hemoptysis   [] Wheeze  [] COPD   [] Asthma Neurologic:  [] Dizziness   [] Seizures   [] History of stroke   [] History of TIA  [] Aphasia   [] Vissual changes   [] Weakness or numbness in arm   [] Weakness or numbness in leg Musculoskeletal:   [] Joint swelling   [] Joint pain   [] Low back pain Hematologic:  [] Easy bruising  [] Easy bleeding   [] Hypercoagulable state   [] Anemic Gastrointestinal:  [] Diarrhea   [] Vomiting  [] Gastroesophageal reflux/heartburn   [] Difficulty swallowing. Genitourinary:  [] Chronic kidney disease   [] Difficult urination  [] Frequent urination   [] Blood in urine Skin:  [] Rashes   [] Ulcers  Psychological:  [] History of anxiety   []  History of major depression.  Physical Examination  Vitals:   02/23/24 1105  BP: 131/83  Pulse: 82  Resp: 18  Weight: 137 lb (62.1 kg)  Height: 5' 4 (1.626 m)    Body mass index is 23.52 kg/m. Gen: WD/WN, NAD Head: Barrett/AT, No temporalis wasting.  Ear/Nose/Throat: Hearing grossly intact, nares w/o erythema or drainage Eyes: PER, EOMI, sclera nonicteric.  Neck: Supple, no masses.  No bruit or JVD.  Pulmonary:  Good air movement, no audible wheezing, no use of accessory muscles.  Cardiac: RRR, normal S1, S2, no Murmurs. Vascular:  mild trophic changes, no open wounds Vessel Right Left  Radial Palpable Palpable  PT Palpable Palpable  DP Palpable Not Palpable  Gastrointestinal: soft, non-distended. No guarding/no peritoneal signs.  Musculoskeletal: M/S 5/5 throughout.  No visible  deformity.  Neurologic: CN 2-12 intact. Pain and light touch intact in extremities.  Symmetrical.  Speech is fluent. Motor exam as listed above. Psychiatric: Judgment intact, Mood & affect appropriate for pt's clinical situation. Dermatologic: No rashes or ulcers noted.  No changes consistent with cellulitis.   CBC Lab Results  Component Value Date   WBC 10.9 (H) 01/25/2024   HGB 9.7 (L) 01/25/2024   HCT 29.8 (L) 01/25/2024   MCV 87.4 01/25/2024   PLT 443 (H) 01/25/2024    BMET    Component Value Date/Time   NA 135 01/25/2024 0213   K 3.8 01/25/2024 0213   CL 101 01/25/2024 0213   CO2 26 01/25/2024 0213   GLUCOSE 157 (H) 01/25/2024 0213   BUN 15 01/25/2024 0213   CREATININE 0.83 01/25/2024 0213   CALCIUM  7.9 (L) 01/25/2024 0213   GFRNONAA >60 01/25/2024 0213   GFRAA >60 02/25/2018 0820   CrCl cannot be calculated (Patient's most recent lab result is older than the maximum 21 days allowed.).  COAG Lab Results  Component Value Date   INR 1.1 01/24/2024    Radiology VAS US  ABI WITH/WO TBI Result Date: 02/25/2024  LOWER EXTREMITY DOPPLER STUDY Patient Name:  Sherri Dawson  Date of Exam:   02/23/2024 Medical Rec #: [id number]           Accession #:    [id number] Date of Birth: [birth year]          Patient Gender: F Patient Age:   68 years Exam  Location:  [facility] Procedure:      VAS US  ABI WITH/WO TBI Referring Phys: [facility] --------------------------------------------------------------------------------  Indications: Peripheral artery disease. High Risk Factors: Hypertension, Diabetes, past history of smoking.  Vascular Interventions: Recent CABG                          Left SFA stent on 06/19/16. Performing Technologist: Donnice Charnley RVT  Examination Guidelines: A complete evaluation includes at minimum, Doppler waveform signals and systolic blood pressure reading at the level of bilateral brachial, anterior tibial, and posterior tibial arteries, when vessel segments are accessible. Bilateral testing is considered an integral part of a complete examination. Photoelectric Plethysmograph (PPG) waveforms and toe systolic pressure readings are included as required and additional duplex testing as needed. Limited examinations for reoccurring indications may be performed as noted.  ABI Findings: +---------+------------------+-----+---------+--------+ Right    Rt Pressure (mmHg)IndexWaveform Comment  +---------+------------------+-----+---------+--------+ Brachial 140                                      +---------+------------------+-----+---------+--------+ PTA      165               1.18 triphasic         +---------+------------------+-----+---------+--------+ DP       160               1.14 triphasic         +---------+------------------+-----+---------+--------+ Great Toe106               0.76                   +---------+------------------+-----+---------+--------+ +---------+------------------+-----+---------+-------+ Left     Lt Pressure (mmHg)IndexWaveform Comment +---------+------------------+-----+---------+-------+ Brachial 138                                     +---------+------------------+-----+---------+-------+  PTA      146               1.04 triphasic         +---------+------------------+-----+---------+-------+ DP       144               1.03 biphasic         +---------+------------------+-----+---------+-------+ Great Toe123               0.88                  +---------+------------------+-----+---------+-------+ +-------+-----------+-----------+------------+------------+ ABI/TBIToday's ABIToday's TBIPrevious ABIPrevious TBI +-------+-----------+-----------+------------+------------+ Right  1.18       0.76       1.18        0.83         +-------+-----------+-----------+------------+------------+ Left   1.04       0.88       1.11        0.79         +-------+-----------+-----------+------------+------------+  Bilateral ABIs appear essentially unchanged compared to prior study on 02/17/2023.  Summary: Right: Resting right ankle-brachial index is within normal range. The right toe-brachial index is normal. Left: Resting left ankle-brachial index is within normal range. The left toe-brachial index is normal. *See table(s) above for measurements and observations.  Electronically signed by Cordella Shawl MD on 02/25/2024 at 2:35:23 PM.    Final      Assessment/Plan 1. Atherosclerosis of native artery of both lower extremities with intermittent claudication (HCC) (Primary) Recommend:   The patient has evidence of atherosclerosis of the lower extremities with claudication.  The patient does not voice lifestyle limiting changes at this point in time.   Noninvasive studies do not suggest clinically significant change.   No invasive studies, angiography or surgery at this time The patient should continue walking and begin a more formal exercise program.  The patient should continue antiplatelet therapy and aggressive treatment of the lipid abnormalities   No changes in the patient's medications at this time   Continued surveillance is indicated as atherosclerosis is likely to progress with time.     The patient will continue  follow up with noninvasive studies as ordered.   - VAS US  ABI WITH/WO TBI; Future  2. Bilateral carotid artery stenosis Recommend:   Given the patient's asymptomatic subcritical stenosis no further invasive testing or surgery at this time.   Duplex ultrasound shows 1-39% stenosis bilaterally.   Continue antiplatelet therapy as prescribed Continue management of CAD, HTN and Hyperlipidemia Healthy heart diet,  encouraged exercise at least 4 times per week Follow up in 24 months with duplex ultrasound and physical exam    3. Atrial fibrillation with rapid ventricular response (HCC) Continue antiarrhythmia medications as already ordered, these medications have been reviewed and there are no changes at this time.  Continue anticoagulation as ordered by Cardiology Service  4. Coronary artery disease involving native coronary artery of native heart without angina pectoris Continue cardiac and antihypertensive medications as already ordered and reviewed, no changes at this time.  Continue statin as ordered and reviewed, no changes at this time  Nitrates PRN for chest pain  5. Mixed hyperlipidemia Continue statin as ordered and reviewed, no changes at this time    Cordella Shawl, MD  02/28/2024 1:10 PM

## 2024-03-16 ENCOUNTER — Encounter: Attending: Internal Medicine | Admitting: *Deleted

## 2024-03-16 DIAGNOSIS — Z951 Presence of aortocoronary bypass graft: Secondary | ICD-10-CM

## 2024-03-16 NOTE — Progress Notes (Signed)
 Initial phone call completed. Diagnosis can be found in [facility] 6/3. EP Orientation scheduled for Thursday 8/7 at 8am.

## 2024-03-17 ENCOUNTER — Encounter: Payer: Self-pay | Admitting: *Deleted

## 2024-03-17 NOTE — Progress Notes (Signed)
 Patient called Cardiac Rehab staff to state she changed her mind and does not wish to attend the program at this time. Orientation appointment cancelled.

## 2024-03-18 ENCOUNTER — Ambulatory Visit

## 2024-05-05 ENCOUNTER — Other Ambulatory Visit: Payer: Self-pay | Admitting: Internal Medicine

## 2024-05-05 DIAGNOSIS — Z1231 Encounter for screening mammogram for malignant neoplasm of breast: Secondary | ICD-10-CM

## 2025-02-21 ENCOUNTER — Encounter (INDEPENDENT_AMBULATORY_CARE_PROVIDER_SITE_OTHER)

## 2025-02-21 ENCOUNTER — Ambulatory Visit (INDEPENDENT_AMBULATORY_CARE_PROVIDER_SITE_OTHER): Admitting: Vascular Surgery
